# Patient Record
Sex: Female | Born: 1975
Health system: Southern US, Community
[De-identification: ages and names within clinical notes are randomized; demographics above are authoritative.]

## PROBLEM LIST (undated history)

## (undated) DIAGNOSIS — N83209 Unspecified ovarian cyst, unspecified side: Secondary | ICD-10-CM

## (undated) DIAGNOSIS — K5792 Diverticulitis of intestine, part unspecified, without perforation or abscess without bleeding: Secondary | ICD-10-CM

## (undated) HISTORY — PX: OVARIAN CYST REMOVAL: SHX89

## (undated) HISTORY — DX: Unspecified ovarian cyst, unspecified side: N83.209

## (undated) HISTORY — DX: Diverticulitis of intestine, part unspecified, without perforation or abscess without bleeding: K57.92

---

## 2004-08-03 HISTORY — PX: COLONOSCOPY: SHX174

## 2004-12-21 ENCOUNTER — Emergency Department: Payer: Self-pay | Admitting: Internal Medicine

## 2004-12-22 ENCOUNTER — Ambulatory Visit: Payer: Self-pay | Admitting: Internal Medicine

## 2005-05-14 ENCOUNTER — Observation Stay: Payer: Self-pay

## 2005-06-16 ENCOUNTER — Observation Stay: Payer: Self-pay

## 2005-06-17 ENCOUNTER — Ambulatory Visit: Payer: Self-pay

## 2005-07-22 ENCOUNTER — Observation Stay: Payer: Self-pay

## 2005-07-24 ENCOUNTER — Inpatient Hospital Stay: Payer: Self-pay | Admitting: Unknown Physician Specialty

## 2006-02-24 ENCOUNTER — Ambulatory Visit: Payer: Self-pay | Admitting: Internal Medicine

## 2006-02-25 ENCOUNTER — Ambulatory Visit: Payer: Self-pay | Admitting: Internal Medicine

## 2006-04-27 ENCOUNTER — Ambulatory Visit: Payer: Self-pay | Admitting: Gastroenterology

## 2008-01-28 ENCOUNTER — Observation Stay: Payer: Self-pay

## 2008-04-19 ENCOUNTER — Inpatient Hospital Stay: Payer: Self-pay | Admitting: Obstetrics & Gynecology

## 2010-07-15 ENCOUNTER — Ambulatory Visit: Payer: Self-pay

## 2010-08-03 HISTORY — PX: TUBAL LIGATION: SHX77

## 2010-12-12 ENCOUNTER — Inpatient Hospital Stay: Payer: Self-pay

## 2011-02-18 ENCOUNTER — Ambulatory Visit: Payer: Self-pay | Admitting: Family Medicine

## 2013-08-03 HISTORY — PX: LAPAROSCOPIC OOPHERECTOMY: SHX6507

## 2014-01-01 ENCOUNTER — Ambulatory Visit: Payer: Self-pay | Admitting: Obstetrics and Gynecology

## 2014-01-01 LAB — CBC
HCT: 37.7 % (ref 35.0–47.0)
HGB: 12.4 g/dL (ref 12.0–16.0)
MCH: 28.6 pg (ref 26.0–34.0)
MCHC: 33 g/dL (ref 32.0–36.0)
MCV: 87 fL (ref 80–100)
Platelet: 292 10*3/uL (ref 150–440)
RBC: 4.34 10*6/uL (ref 3.80–5.20)
RDW: 14.4 % (ref 11.5–14.5)
WBC: 11.2 10*3/uL — AB (ref 3.6–11.0)

## 2014-01-01 LAB — BASIC METABOLIC PANEL
Anion Gap: 8 (ref 7–16)
BUN: 11 mg/dL (ref 7–18)
Calcium, Total: 8.9 mg/dL (ref 8.5–10.1)
Chloride: 106 mmol/L (ref 98–107)
Co2: 24 mmol/L (ref 21–32)
Creatinine: 0.59 mg/dL — ABNORMAL LOW (ref 0.60–1.30)
EGFR (African American): >60
EGFR (Non-African Amer.): >60
GLUCOSE: 73 mg/dL (ref 65–99)
OSMOLALITY: 274 (ref 275–301)
Potassium: 3.8 mmol/L (ref 3.5–5.1)
Sodium: 138 mmol/L (ref 136–145)

## 2014-01-11 ENCOUNTER — Ambulatory Visit: Payer: Self-pay | Admitting: Obstetrics and Gynecology

## 2014-01-12 LAB — PATHOLOGY REPORT

## 2014-11-24 NOTE — Op Note (Signed)
PATIENT NAME:  Sherri Avila, Sherri Avila MR#:  [id number] DATE OF BIRTH:  1976-04-13  DATE OF PROCEDURE:  01/11/2014  PREOPERATIVE DIAGNOSIS: Pelvic mass.   POSTOPERATIVE DIAGNOSIS: Right ovarian cyst, likely serous cystadenoma based on appearance.   OPERATION PERFORMED: Laparoscopic bilateral salpingectomy, right oophorectomy.   ANESTHESIA USED: General.   PREOPERATIVE ANTIBIOTICS: None.   PRIMARY SURGEON: Florina Ou. Bonney Aid, MD  ASSISTANT: Conard Novak, MD  ESTIMATED BLOOD LOSS: 15 mL.  OPERATIVE FLUIDS: 1500 mL of crystalloid.   URINE OUTPUT: 200 mL of clear urine.   DRAINS OR TUBES: None.   IMPLANTS: None.   COMPLICATIONS: None.   FINDINGS: Normal intra-abdominal anatomy other than enlarged right ovary filling the midline of the pelvis. The uterus appeared grossly normal. Appendix appeared to be retrocecal and was not clearly visualized. The liver appeared normal in texture and color. The ureters were visualized bilaterally prior to and post procedure.   SPECIMENS REMOVED: Right tube and ovary as well as left tube.   PATIENT CONDITION FOLLOWING THE PROCEDURE: Stable.   PROCEDURE IN DETAIL: Risks, benefits and alternatives of the procedure were discussed with the patient prior to proceeding to the operating room. The patient was taken to the operating room, where she was placed under general endotracheal anesthesia. She was positioned in the dorsal lithotomy position using Allen stirrups, prepped and draped in the usual sterile fashion. A timeout procedure was performed. Attention was turned to the patient's pelvis. The bladder was straight catheterized using a red rubber catheter. An operative speculum was then placed. The anterior lip of the cervix was visualized and grasped with a single-tooth tenaculum. A Hulka tenaculum was then placed to allow manipulation of the uterus. The single-tooth tenaculum and speculum were removed. Attention was turned to the patient's abdomen.  The umbilicus was infiltrated with 1% lidocaine. A stab incision was made at the base of the umbilicus, and a 5 mm XCEL trocar was used to gain direct entry into the peritoneal cavity under visualization. Pneumoperitoneum was then established A 5 mm right assistant port and an 11 mm left assistant port were then placed under direct visualization. Attention was turned to the patient's right adnexa. The enlarged ovary was elevated using a blunt grasper. The ureter was visualized coursing well away from the IP ligament. The 5 mm Harmonic was then used to transect the IP ligament and free the ovary and tube from its attachments to the mesosalpinx and uterus. Once this had been accomplished, an Endo Catch bag was placed into the abdomen to attempt removal of the ovary. However, the ovary was too large to fit in the standard Endo Catch bag, and a laparoscopic sac was used to facilitate removal. In order to place the laparoscopic sac, the 11 mm left port was exchanged for a 15 mm port. The specimen was placed into the lap sac, and the lap sac was pulled out of the port site. The port was then removed around the lap sac. The ovary was lysed within the bag and was noted to contain clear serous fluid. The ovary was then removed piecemeal using a Kelly clamp until it was sufficiently decompressed to allow removal through the 15 mm port site. The 15 mm port was then replaced. Attention was turned to the patient's left adnexal structure. The left tube appeared grossly normal, had undergone prior ligation. It was removed from its attachments to the ovary and mesosalpinx using the 5 mm Harmonic. The pedicles were then reinspected and noted to be hemostatic.  Both ureters were once again visualized and noted to be peristalsing well away from the site of dissection. A Carter-Thomason was then used to close the 15 mm port site. There was a small defect that was still noted within the fascia which was closed with a #1 Vicryl on a UR-6  needle. Following this, there was no defect left in the fascia. Pneumoperitoneum was evacuated. The 15 mm port site was closed using a 4-0 Monocryl in a subcuticular fashion. All port sites were then dressed with Dermabond. Sponge, needle and instrument counts were correct x2. The patient tolerated the procedure well and was taken to the recovery room in stable condition.   ____________________________ Florina OuAndreas M. Bonney AidStaebler, MD ams:lb D: 01/12/2014 09:15:38 ET T: 01/12/2014 09:46:09 ET JOB#: [id number]  cc: Florina OuAndreas M. Bonney AidStaebler, MD, <Dictator> Carmel SacramentoANDREAS Cathrine MusterM Nettie Wyffels MD ELECTRONICALLY SIGNED 01/23/2014 9:13

## 2015-10-28 ENCOUNTER — Ambulatory Visit
Admission: EM | Admit: 2015-10-28 | Discharge: 2015-10-28 | Disposition: A | Discharge status: Home / Self Care | Payer: 59 | Attending: Emergency Medicine | Admitting: Emergency Medicine

## 2015-10-28 ENCOUNTER — Encounter: Payer: Self-pay | Admitting: Emergency Medicine

## 2015-10-28 DIAGNOSIS — K5732 Diverticulitis of large intestine without perforation or abscess without bleeding: Secondary | ICD-10-CM | POA: Diagnosis not present

## 2015-10-28 DIAGNOSIS — R1030 Lower abdominal pain, unspecified: Secondary | ICD-10-CM | POA: Diagnosis not present

## 2015-10-28 DIAGNOSIS — R1031 Right lower quadrant pain: Secondary | ICD-10-CM | POA: Diagnosis present

## 2015-10-28 LAB — COMPREHENSIVE METABOLIC PANEL
ALBUMIN: 4.2 g/dL (ref 3.5–5.0)
ALT: 29 U/L (ref 14–54)
ANION GAP: 7 (ref 5–15)
AST: 23 U/L (ref 15–41)
Alkaline Phosphatase: 67 U/L (ref 38–126)
BUN: 12 mg/dL (ref 6–20)
CHLORIDE: 103 mmol/L (ref 101–111)
CO2: 25 mmol/L (ref 22–32)
Calcium: 9 mg/dL (ref 8.9–10.3)
Creatinine, Ser: 0.69 mg/dL (ref 0.44–1.00)
GFR calc Af Amer: >60 mL/min (ref >60)
GFR calc non Af Amer: >60 mL/min (ref >60)
GLUCOSE: 86 mg/dL (ref 65–99)
POTASSIUM: 3.4 mmol/L — AB (ref 3.5–5.1)
SODIUM: 135 mmol/L (ref 135–145)
Total Bilirubin: 0.9 mg/dL (ref 0.3–1.2)
Total Protein: 8.2 g/dL — ABNORMAL HIGH (ref 6.5–8.1)

## 2015-10-28 LAB — CBC
HEMATOCRIT: 37.4 % (ref 35.0–47.0)
Hemoglobin: 12.6 g/dL (ref 12.0–16.0)
MCH: 28.1 pg (ref 26.0–34.0)
MCHC: 33.7 g/dL (ref 32.0–36.0)
MCV: 83.6 fL (ref 80.0–100.0)
PLATELETS: 290 10*3/uL (ref 150–440)
RBC: 4.48 MIL/uL (ref 3.80–5.20)
RDW: 14.3 % (ref 11.5–14.5)
WBC: 16.2 10*3/uL — AB (ref 3.6–11.0)

## 2015-10-28 LAB — URINALYSIS COMPLETE WITH MICROSCOPIC (ARMC ONLY)
GLUCOSE, UA: NEGATIVE mg/dL
Leukocytes, UA: NEGATIVE
Nitrite: NEGATIVE
Protein, ur: 100 mg/dL — AB
SPECIFIC GRAVITY, URINE: 1.02 (ref 1.005–1.030)
pH: 7 (ref 5.0–8.0)

## 2015-10-28 LAB — LIPASE, BLOOD: Lipase: 20 U/L (ref 11–51)

## 2015-10-28 LAB — PREGNANCY, URINE: PREG TEST UR: NEGATIVE

## 2015-10-28 MED ORDER — KETOROLAC TROMETHAMINE 60 MG/2ML IM SOLN
60.0000 mg | Freq: Once | INTRAMUSCULAR | Status: AC
  Administered 2015-10-28: 60 mg via INTRAMUSCULAR

## 2015-10-28 NOTE — Discharge Instructions (Signed)
Go to the [facility] ED now. Let them know if your abdominal pain changes, gets worse.

## 2015-10-28 NOTE — ED Notes (Signed)
Pt in with co mid abd pain for few days no n.v.d, no dysuria.  Pt has had fever at home.

## 2015-10-28 NOTE — ED Provider Notes (Signed)
HPI  SUBJECTIVE:  Sherri Avila is a 40 y.o. female who presents with sharp contraction-like, squeezing, cramping, constant lower abdominal pain for the last 2 days. States it is becoming more intense. Reports fevers Tmax 101.4. She reports anorexia, bilateral low back pain. No nausea, vomiting, abdominal distention. Had a normal bowel movement yesterday. No dysuria, urgency, frequency, cloudy or odorous urine, hematuria. No vaginal bleeding, discharge, vaginal odor, genital rash or itching. She is in a monogamous relationship with her husband, who is asymptomatic. STDs are not a concern today. Symptoms are worse with walking, movement, coughing, standing up straight. States the car ride over here was painful. No alleviating factors. She has tried Tylenol for this. She has taken an antipyretic in the past 6-8 hours. Past medical history of UTI, pyelonephritis, yeast infections. She is status post right nephrectomy with fallopian tube removal for an ovarian cyst. She still has her uterus and left ovary. Also a history of C-section. No history of gonorrhea, chlamydia, herpes, HIV, syphilis, BV, Trichomonas. LMP 3/15. PMD: Dr. Greggory Stallion at [facility] primary care.    History reviewed. No pertinent past medical history.  Past Surgical History  Procedure Laterality Date  . Cesarean section    . Ovarian cyst removal      History reviewed. No pertinent family history.  Social History  Substance Use Topics  . Smoking status: Never Smoker   . Smokeless tobacco: None  . Alcohol Use: Yes    No current facility-administered medications for this encounter. No current outpatient prescriptions on file.  No Known Allergies   ROS  As noted in HPI.   Physical Exam  BP 135/83 mmHg  Pulse 117  Temp(Src) 99.9 F (37.7 C) (Tympanic)  Resp 20  Ht  (1.626 m)  Wt 203 lb (92.08 kg)  BMI 34.83 kg/m2  SpO2 100%  LMP 10/16/2015 (Approximate)  Constitutional: Well developed, well nourished, Appears  uncomfortable. Eyes:  EOMI, conjunctiva normal bilaterally HENT: Normocephalic, atraumatic,mucus membranes moist Respiratory: Normal inspiratory effort Cardiovascular: Regular tachycardia GI: Normal appearance Soft, nondistended. Positive suprapubic and right lower quadrant tenderness. Pain most suprapubic area. Positive guarding, positive rebound. No flank tenderness. Pain with movement, reports abdominal pain when the table was kicked. Back: Questionable right CVA tenderness. GU: Deferred. skin: No rash, skin intact Musculoskeletal: no deformities Neurologic: Alert & oriented x 3, no focal neuro deficits Psychiatric: Speech and behavior appropriate   ED Course   Medications  ketorolac (TORADOL) injection 60 mg (60 mg Intramuscular Given 10/28/15 2104)    Orders Placed This Encounter  Procedures  . Urinalysis complete, with microscopic    Standing Status: Standing     Number of Occurrences: 1     Standing Expiration Date:   . Pregnancy, urine    Standing Status: Standing     Number of Occurrences: 1     Standing Expiration Date:     Results for orders placed or performed during the hospital encounter of 10/28/15 (from the past 24 hour(s))  Urinalysis complete, with microscopic     Status: Abnormal   Collection Time: 10/28/15  8:14 PM  Result Value Ref Range   Color, Urine YELLOW YELLOW   APPearance CLEAR CLEAR   Glucose, UA NEGATIVE NEGATIVE mg/dL   Bilirubin Urine 1+ (A) NEGATIVE   Ketones, ur 2+ (A) NEGATIVE mg/dL   Specific Gravity, Urine 1.020 1.005 - 1.030   Hgb urine dipstick 2+ (A) NEGATIVE   pH 7.0 5.0 - 8.0   Protein, ur 100 (A) NEGATIVE  mg/dL   Nitrite NEGATIVE NEGATIVE   Leukocytes, UA NEGATIVE NEGATIVE   RBC / HPF 6-30 0 - 5 RBC/hpf   WBC, UA 0-5 0 - 5 WBC/hpf   Bacteria, UA FEW (A) NONE SEEN   Squamous Epithelial / LPF 0-5 (A) NONE SEEN   Ca Oxalate Crys, UA PRESENT   Pregnancy, urine     Status: None   Collection Time: 10/28/15  8:14 PM  Result  Value Ref Range   Preg Test, Ur NEGATIVE NEGATIVE   No results found.  ED Clinical Impression  Lower abdominal pain   ED Assessment/Plan   Physical exam is concerning for surgical abdomen. In the differential is appendicitis, colitis, UTI, GU infection, endometriosis. Feel the patient is low risk for STI/PID. giving shot of Toradol here for pain control, will send to the [facility] ED for further workup. Declined Zofran. Discussed rationale for transfer. Patient agrees with plan.    *This clinic note was created using Dragon dictation software. Therefore, there may be occasional mistakes despite careful proofreading.  ?   Domenick GongAshley Gilliam Hawkes, MD 10/28/15 2155

## 2015-10-28 NOTE — ED Notes (Signed)
Patient states she has had a fever and sharp cramping abdominal pain for the past 48 hours

## 2015-10-29 ENCOUNTER — Emergency Department
Admission: EM | Admit: 2015-10-29 | Discharge: 2015-10-29 | Disposition: A | Discharge status: Home / Self Care | Payer: 59 | Attending: Emergency Medicine | Admitting: Emergency Medicine

## 2015-10-29 ENCOUNTER — Encounter: Payer: Self-pay | Admitting: Emergency Medicine

## 2015-10-29 ENCOUNTER — Emergency Department: Payer: 59

## 2015-10-29 DIAGNOSIS — K5732 Diverticulitis of large intestine without perforation or abscess without bleeding: Secondary | ICD-10-CM

## 2015-10-29 LAB — URINALYSIS COMPLETE WITH MICROSCOPIC (ARMC ONLY)
BILIRUBIN URINE: NEGATIVE
GLUCOSE, UA: NEGATIVE mg/dL
Nitrite: NEGATIVE
Protein, ur: 30 mg/dL — AB
Specific Gravity, Urine: 1.018 (ref 1.005–1.030)
pH: 6 (ref 5.0–8.0)

## 2015-10-29 LAB — POCT PREGNANCY, URINE: Preg Test, Ur: NEGATIVE

## 2015-10-29 MED ORDER — METRONIDAZOLE IN NACL 5-0.79 MG/ML-% IV SOLN
500.0000 mg | Freq: Once | INTRAVENOUS | Status: AC
  Administered 2015-10-29: 500 mg via INTRAVENOUS
  Filled 2015-10-29: qty 100

## 2015-10-29 MED ORDER — CIPROFLOXACIN IN D5W 400 MG/200ML IV SOLN
400.0000 mg | Freq: Once | INTRAVENOUS | Status: AC
  Administered 2015-10-29: 400 mg via INTRAVENOUS
  Filled 2015-10-29: qty 200

## 2015-10-29 MED ORDER — CIPROFLOXACIN HCL 500 MG PO TABS: 500.0000 mg | ORAL_TABLET | Freq: Two times a day (BID) | ORAL | Status: AC

## 2015-10-29 MED ORDER — METRONIDAZOLE 500 MG PO TABS: 500.0000 mg | ORAL_TABLET | Freq: Two times a day (BID) | ORAL | Status: AC

## 2015-10-29 MED ORDER — SODIUM CHLORIDE 0.9 % IV BOLUS (SEPSIS)
1000.0000 mL | Freq: Once | INTRAVENOUS | Status: AC
  Administered 2015-10-29: 1000 mL via INTRAVENOUS

## 2015-10-29 MED ORDER — IOPAMIDOL (ISOVUE-300) INJECTION 61%
100.0000 mL | Freq: Once | INTRAVENOUS | Status: AC | PRN
  Administered 2015-10-29: 100 mL via INTRAVENOUS

## 2015-10-29 MED ORDER — ONDANSETRON HCL 4 MG/2ML IJ SOLN
4.0000 mg | Freq: Once | INTRAMUSCULAR | Status: AC
  Administered 2015-10-29: 4 mg via INTRAVENOUS
  Filled 2015-10-29: qty 2

## 2015-10-29 MED ORDER — ONDANSETRON 4 MG PO TBDP
4.0000 mg | ORAL_TABLET | Freq: Three times a day (TID) | ORAL | Status: DC | PRN
Start: 2015-10-29 — End: 2015-12-26

## 2015-10-29 MED ORDER — MORPHINE SULFATE (PF) 4 MG/ML IV SOLN
4.0000 mg | Freq: Once | INTRAVENOUS | Status: AC
  Administered 2015-10-29: 4 mg via INTRAVENOUS
  Filled 2015-10-29: qty 1

## 2015-10-29 MED ORDER — DIATRIZOATE MEGLUMINE & SODIUM 66-10 % PO SOLN
15.0000 mL | Freq: Once | ORAL | Status: AC
  Administered 2015-10-29: 15 mL via ORAL

## 2015-10-29 MED ORDER — OXYCODONE-ACETAMINOPHEN 5-325 MG PO TABS: 1.0000 | ORAL_TABLET | Freq: Four times a day (QID) | ORAL | Status: DC | PRN

## 2015-10-29 NOTE — ED Provider Notes (Signed)
[facility] Emergency Department Provider Note  ____________________________________________  Time seen: Approximately 0057 AM  I have reviewed the triage vital signs and the nursing notes.   HISTORY  Chief Complaint Abdominal Pain    HPI Kathrin Folden is a 40 y.o. female who comes into the hospital today with sharp crampy abdominal pain. She also reports that she's had a fever. The patient's symptoms started her on Saturday night. She reports that she had a temperature to 101.4. She reports that this is mostly in her mid abdomen and she denies any vomiting or diarrhea. The patient has been taking Tylenol for the pain but it has not been helping. The patient went to urgent care today and was told to come here. She was given a shot of Toradol for pain but reports her pain is still a 7 out of 10 in intensity. She denies any sick contacts, pain with urination or dark looking urine. She reports that she feels as though she is in labor and has never had pain like this in the past. The patient was concerned she came in to get checked out.   History reviewed. No pertinent past medical history.  There are no active problems to display for this patient.   Past Surgical History  Procedure Laterality Date  . Cesarean section    . Ovarian cyst removal      Current Outpatient Rx  Name  Route  Sig  Dispense  Refill  . ciprofloxacin (CIPRO) 500 MG tablet   Oral   Take 1 tablet (500 mg total) by mouth 2 (two) times daily.   20 tablet   0   . metroNIDAZOLE (FLAGYL) 500 MG tablet   Oral   Take 1 tablet (500 mg total) by mouth 2 (two) times daily.   20 tablet   0   . ondansetron (ZOFRAN ODT) 4 MG disintegrating tablet   Oral   Take 1 tablet (4 mg total) by mouth every 8 (eight) hours as needed for nausea or vomiting.   20 tablet   0   . oxyCODONE-acetaminophen (ROXICET) 5-325 MG tablet   Oral   Take 1 tablet by mouth every 6 (six) hours as needed.   12  tablet   0     Allergies Review of patient's allergies indicates no known allergies.  No family history on file.  Social History Social History  Substance Use Topics  . Smoking status: Never Smoker   . Smokeless tobacco: None  . Alcohol Use: Yes    Review of Systems Constitutional: No fever/chills Eyes: No visual changes. ENT: No sore throat. Cardiovascular: Denies chest pain. Respiratory: Denies shortness of breath. Gastrointestinal:  abdominal pain.  No nausea, no vomiting.  No diarrhea.  No constipation. Genitourinary: Negative for dysuria. Musculoskeletal: Negative for back pain. Skin: Negative for rash. Neurological: Negative for headaches, focal weakness or numbness.  10-point ROS otherwise negative.  ____________________________________________   PHYSICAL EXAM:  VITAL SIGNS: ED Triage Vitals  Enc Vitals Group     BP 10/28/15 2146 129/72 mmHg     Pulse Rate 10/28/15 2146 103     Resp 10/28/15 2146 18     Temp 10/28/15 2146 99.1 F (37.3 C)     Temp Source 10/28/15 2146 Oral     SpO2 10/28/15 2146 98 %     Weight 10/28/15 2146 200 lb (90.719 kg)     Height 10/28/15 2146  (1.575 m)     Head Cir --  Peak Flow --      Pain Score 10/28/15 2147 7     Pain Loc --      Pain Edu? --      Excl. in GC? --     Constitutional: Alert and oriented. Well appearing and in mild distress. Eyes: Conjunctivae are normal. PERRL. EOMI. Head: Atraumatic. Nose: No congestion/rhinnorhea. Mouth/Throat: Mucous membranes are moist.  Oropharynx non-erythematous. Cardiovascular: Normal rate, regular rhythm. Grossly normal heart sounds.  Good peripheral circulation. Respiratory: Normal respiratory effort.  No retractions. Lungs CTAB. Gastrointestinal: Soft with right lower quadrant tendereness to palpation. No distention. Positive bowel sounds Musculoskeletal: No lower extremity tenderness nor edema.   Neurologic:  Normal speech and language.  Skin:  Skin is warm,  dry and intact.  Psychiatric: Mood and affect are normal.   ____________________________________________   LABS (all labs ordered are listed, but only abnormal results are displayed)  Labs Reviewed  CBC - Abnormal; Notable for the following:    WBC 16.2 (*)    All other components within normal limits  COMPREHENSIVE METABOLIC PANEL - Abnormal; Notable for the following:    Potassium 3.4 (*)    Total Protein 8.2 (*)    All other components within normal limits  URINALYSIS COMPLETEWITH MICROSCOPIC ([facility] ONLY) - Abnormal; Notable for the following:    Color, Urine YELLOW (*)    APPearance HAZY (*)    Ketones, ur TRACE (*)    Hgb urine dipstick 2+ (*)    Protein, ur 30 (*)    Leukocytes, UA 1+ (*)    Bacteria, UA RARE (*)    Squamous Epithelial / LPF 0-5 (*)    All other components within normal limits  LIPASE, BLOOD  POCT PREGNANCY, URINE   ____________________________________________  EKG  none ____________________________________________  RADIOLOGY  CT abd and pelvis: Findings consistent with acute sigmoid diverticulitis, no evidence for perforation or other comp location, no other acute intra-abdominal or pelvic process, normal appendix, hepatic steatosis. ____________________________________________   PROCEDURES  Procedure(s) performed: None  Critical Care performed: No  ____________________________________________   INITIAL IMPRESSION / ASSESSMENT AND PLAN / ED COURSE  Pertinent labs & imaging results that were available during my care of the patient were reviewed by me and considered in my medical decision making (see chart for details).  This is a 40 year old female who comes into the hospital today with some abdominal pain. The patient reports it is tender mid abdomen but she is having pain in her right upper and lower quadrant. I will give the patient a dose of morphine as well as Zofran and sent her for a CT scan of her abdomen and pelvis.  The  patient's CT scan is consistent with diverticulitis. I did give the patient a dose of ciprofloxacin and Flagyl. The patient is able to take some oral medication and her pain is improved. She'll be discharged to home for at home trial of treating her for diverticulitis. I did inform the patient of the strict return precautions such as worsening fevers, worsening pain, vomiting or any other significant concerns. The patient and her spouse understand and agree with the plans as stated. ____________________________________________   FINAL CLINICAL IMPRESSION(S) / ED DIAGNOSES  Final diagnoses:  Diverticulitis of large intestine without perforation or abscess without bleeding      Rebecka ApleyAllison P Webster, MD 10/29/15 [phone]

## 2015-10-29 NOTE — ED Notes (Signed)
Patient transported to CT 

## 2015-10-29 NOTE — ED Notes (Signed)
Patient back from CT.

## 2015-10-29 NOTE — Discharge Instructions (Signed)
Diverticulitis °Diverticulitis is inflammation or infection of small pouches in your colon that form when you have a condition called diverticulosis. The pouches in your colon are called diverticula. Your colon, or large intestine, is where water is absorbed and stool is formed. °Complications of diverticulitis can include: °· Bleeding. °· Severe infection. °· Severe pain. °· Perforation of your colon. °· Obstruction of your colon. °CAUSES  °Diverticulitis is caused by bacteria. °Diverticulitis happens when stool becomes trapped in diverticula. This allows bacteria to grow in the diverticula, which can lead to inflammation and infection. °RISK FACTORS °People with diverticulosis are at risk for diverticulitis. Eating a diet that does not include enough fiber from fruits and vegetables may make diverticulitis more likely to develop. °SYMPTOMS  °Symptoms of diverticulitis may include: °· Abdominal pain and tenderness. The pain is normally located on the left side of the abdomen, but may occur in other areas. °· Fever and chills. °· Bloating. °· Cramping. °· Nausea. °· Vomiting. °· Constipation. °· Diarrhea. °· Blood in your stool. °DIAGNOSIS  °Your health care provider will ask you about your medical history and do a physical exam. You may need to have tests done because many medical conditions can cause the same symptoms as diverticulitis. Tests may include: °· Blood tests. °· Urine tests. °· Imaging tests of the abdomen, including X-rays and CT scans. °When your condition is under control, your health care provider may recommend that you have a colonoscopy. A colonoscopy can show how severe your diverticula are and whether something else is causing your symptoms. °TREATMENT  °Most cases of diverticulitis are mild and can be treated at home. Treatment may include: °· Taking over-the-counter pain medicines. °· Following a clear liquid diet. °· Taking antibiotic medicines by mouth for 7-10 days. °More severe cases may  be treated at a hospital. Treatment may include: °· Not eating or drinking. °· Taking prescription pain medicine. °· Receiving antibiotic medicines through an IV tube. °· Receiving fluids and nutrition through an IV tube. °· Surgery. °HOME CARE INSTRUCTIONS  °· Follow your health care provider's instructions carefully. °· Follow a full liquid diet or other diet as directed by your health care provider. After your symptoms improve, your health care provider may tell you to change your diet. He or she may recommend you eat a high-fiber diet. Fruits and vegetables are good sources of fiber. Fiber makes it easier to pass stool. °· Take fiber supplements or probiotics as directed by your health care provider. °· Only take medicines as directed by your health care provider. °· Keep all your follow-up appointments. °SEEK MEDICAL CARE IF:  °· Your pain does not improve. °· You have a hard time eating food. °· Your bowel movements do not return to normal. °SEEK IMMEDIATE MEDICAL CARE IF:  °· Your pain becomes worse. °· Your symptoms do not get better. °· Your symptoms suddenly get worse. °· You have a fever. °· You have repeated vomiting. °· You have bloody or black, tarry stools. °MAKE SURE YOU:  °· Understand these instructions. °· Will watch your condition. °· Will get help right away if you are not doing well or get worse. °  °This information is not intended to replace advice given to you by your health care provider. Make sure you discuss any questions you have with your health care provider. °  °Document Released: 04/29/2005 Document Revised: 07/25/2013 Document Reviewed: 06/14/2013 °Elsevier Interactive Patient Education ©2016 [employer] ° °

## 2015-10-29 NOTE — ED Notes (Signed)
Patient with no complaints at this time. Respirations even and unlabored. Skin warm/dry. Discharge instructions reviewed with patient at this time. Patient given opportunity to voice concerns/ask questions. IV removed per policy and band-aid applied to site. Patient discharged at this time and left Emergency Department with steady gait.  

## 2015-12-26 ENCOUNTER — Encounter: Payer: Self-pay | Admitting: Emergency Medicine

## 2015-12-26 ENCOUNTER — Other Ambulatory Visit: Payer: Self-pay | Admitting: Family Medicine

## 2015-12-26 ENCOUNTER — Inpatient Hospital Stay
Admission: EM | Admit: 2015-12-26 | Discharge: 2015-12-29 | DRG: 392 | Disposition: A | Discharge status: Home / Self Care | Payer: 59 | Attending: General Surgery | Admitting: General Surgery

## 2015-12-26 ENCOUNTER — Ambulatory Visit
Admission: RE | Admit: 2015-12-26 | Discharge: 2015-12-26 | Disposition: A | Discharge status: Home / Self Care | Payer: 59 | Source: Ambulatory Visit | Attending: Family Medicine | Admitting: Family Medicine

## 2015-12-26 DIAGNOSIS — R197 Diarrhea, unspecified: Secondary | ICD-10-CM | POA: Insufficient documentation

## 2015-12-26 DIAGNOSIS — R109 Unspecified abdominal pain: Secondary | ICD-10-CM

## 2015-12-26 DIAGNOSIS — R509 Fever, unspecified: Secondary | ICD-10-CM

## 2015-12-26 DIAGNOSIS — R935 Abnormal findings on diagnostic imaging of other abdominal regions, including retroperitoneum: Secondary | ICD-10-CM | POA: Insufficient documentation

## 2015-12-26 DIAGNOSIS — R1084 Generalized abdominal pain: Secondary | ICD-10-CM | POA: Insufficient documentation

## 2015-12-26 DIAGNOSIS — R112 Nausea with vomiting, unspecified: Secondary | ICD-10-CM

## 2015-12-26 DIAGNOSIS — K5720 Diverticulitis of large intestine with perforation and abscess without bleeding: Secondary | ICD-10-CM | POA: Insufficient documentation

## 2015-12-26 DIAGNOSIS — K5792 Diverticulitis of intestine, part unspecified, without perforation or abscess without bleeding: Secondary | ICD-10-CM | POA: Diagnosis present

## 2015-12-26 LAB — URINALYSIS COMPLETE WITH MICROSCOPIC (ARMC ONLY)
Bilirubin Urine: NEGATIVE
Glucose, UA: NEGATIVE mg/dL
Leukocytes, UA: NEGATIVE
Nitrite: POSITIVE — AB
PROTEIN: NEGATIVE mg/dL
Specific Gravity, Urine: 1.04 — ABNORMAL HIGH (ref 1.005–1.030)
pH: 7 (ref 5.0–8.0)

## 2015-12-26 LAB — COMPREHENSIVE METABOLIC PANEL
ALBUMIN: 3.9 g/dL (ref 3.5–5.0)
ALT: 25 U/L (ref 14–54)
AST: 21 U/L (ref 15–41)
Alkaline Phosphatase: 58 U/L (ref 38–126)
Anion gap: 9 (ref 5–15)
BUN: 10 mg/dL (ref 6–20)
CHLORIDE: 97 mmol/L — AB (ref 101–111)
CO2: 25 mmol/L (ref 22–32)
CREATININE: 0.79 mg/dL (ref 0.44–1.00)
Calcium: 9 mg/dL (ref 8.9–10.3)
GFR calc non Af Amer: >60 mL/min (ref >60)
GLUCOSE: 87 mg/dL (ref 65–99)
Potassium: 3.4 mmol/L — ABNORMAL LOW (ref 3.5–5.1)
SODIUM: 131 mmol/L — AB (ref 135–145)
Total Bilirubin: 1.5 mg/dL — ABNORMAL HIGH (ref 0.3–1.2)
Total Protein: 8.3 g/dL — ABNORMAL HIGH (ref 6.5–8.1)

## 2015-12-26 LAB — CBC WITH DIFFERENTIAL/PLATELET
Basophils Absolute: 0 10*3/uL (ref 0–0.1)
Basophils Relative: 0 %
Eosinophils Absolute: 0 10*3/uL (ref 0–0.7)
HCT: 35.6 % (ref 35.0–47.0)
HEMOGLOBIN: 12.1 g/dL (ref 12.0–16.0)
LYMPHS ABS: 1.5 10*3/uL (ref 1.0–3.6)
MCH: 28.3 pg (ref 26.0–34.0)
MCHC: 33.9 g/dL (ref 32.0–36.0)
MCV: 83.7 fL (ref 80.0–100.0)
Monocytes Absolute: 0.5 10*3/uL (ref 0.2–0.9)
Monocytes Relative: 3 %
Neutro Abs: 13.2 10*3/uL — ABNORMAL HIGH (ref 1.4–6.5)
Platelets: 264 10*3/uL (ref 150–440)
RBC: 4.26 MIL/uL (ref 3.80–5.20)
RDW: 15.1 % — ABNORMAL HIGH (ref 11.5–14.5)
WBC: 15.2 10*3/uL — AB (ref 3.6–11.0)

## 2015-12-26 LAB — LACTIC ACID, PLASMA: Lactic Acid, Venous: 1 mmol/L (ref 0.5–2.0)

## 2015-12-26 MED ORDER — SODIUM CHLORIDE 0.9 % IV SOLN
Freq: Once | INTRAVENOUS | Status: AC
  Administered 2015-12-26: 16:00:00 via INTRAVENOUS

## 2015-12-26 MED ORDER — MORPHINE SULFATE (PF) 4 MG/ML IV SOLN
4.0000 mg | Freq: Once | INTRAVENOUS | Status: AC
  Administered 2015-12-26: 4 mg via INTRAVENOUS
  Filled 2015-12-26: qty 1

## 2015-12-26 MED ORDER — LACTATED RINGERS IV SOLN
INTRAVENOUS | Status: DC
  Administered 2015-12-26 – 2015-12-27 (×3): via INTRAVENOUS

## 2015-12-26 MED ORDER — HYDRALAZINE HCL 20 MG/ML IJ SOLN: 10.0000 mg | INTRAMUSCULAR | Status: DC | PRN

## 2015-12-26 MED ORDER — PIPERACILLIN-TAZOBACTAM 3.375 G IVPB: 3.3750 g | Freq: Three times a day (TID) | INTRAVENOUS | Status: DC

## 2015-12-26 MED ORDER — DIPHENHYDRAMINE HCL 50 MG/ML IJ SOLN: 25.0000 mg | Freq: Four times a day (QID) | INTRAMUSCULAR | Status: DC | PRN

## 2015-12-26 MED ORDER — ACETAMINOPHEN 325 MG PO TABS
650.0000 mg | ORAL_TABLET | Freq: Four times a day (QID) | ORAL | Status: DC | PRN
  Administered 2015-12-26 – 2015-12-28 (×4): 650 mg via ORAL
  Filled 2015-12-26 (×4): qty 2

## 2015-12-26 MED ORDER — PIPERACILLIN-TAZOBACTAM 3.375 G IVPB
3.3750 g | Freq: Three times a day (TID) | INTRAVENOUS | Status: DC
  Administered 2015-12-26 – 2015-12-28 (×5): 3.375 g via INTRAVENOUS
  Filled 2015-12-26 (×7): qty 50

## 2015-12-26 MED ORDER — PIPERACILLIN-TAZOBACTAM 3.375 G IVPB 30 MIN
3.3750 g | Freq: Once | INTRAVENOUS | Status: AC
  Administered 2015-12-26: 3.375 g via INTRAVENOUS
  Filled 2015-12-26: qty 50

## 2015-12-26 MED ORDER — MORPHINE SULFATE (PF) 4 MG/ML IV SOLN
4.0000 mg | INTRAVENOUS | Status: DC | PRN
  Administered 2015-12-26 – 2015-12-28 (×9): 4 mg via INTRAVENOUS
  Filled 2015-12-26 (×9): qty 1

## 2015-12-26 MED ORDER — DIPHENHYDRAMINE HCL 25 MG PO CAPS: 25.0000 mg | ORAL_CAPSULE | Freq: Four times a day (QID) | ORAL | Status: DC | PRN

## 2015-12-26 MED ORDER — HYDROMORPHONE HCL 1 MG/ML IJ SOLN
INTRAMUSCULAR | Status: AC
  Administered 2015-12-26: 0.5 mg via INTRAVENOUS
  Filled 2015-12-26: qty 1

## 2015-12-26 MED ORDER — ONDANSETRON HCL 4 MG/2ML IJ SOLN: 4.0000 mg | Freq: Four times a day (QID) | INTRAMUSCULAR | Status: DC | PRN

## 2015-12-26 MED ORDER — HYDROMORPHONE HCL 1 MG/ML IJ SOLN
0.5000 mg | INTRAMUSCULAR | Status: AC
  Administered 2015-12-26: 0.5 mg via INTRAVENOUS

## 2015-12-26 MED ORDER — IOPAMIDOL (ISOVUE-300) INJECTION 61%
100.0000 mL | Freq: Once | INTRAVENOUS | Status: AC | PRN
  Administered 2015-12-26: 100 mL via INTRAVENOUS

## 2015-12-26 MED ORDER — ENOXAPARIN SODIUM 40 MG/0.4ML ~~LOC~~ SOLN
40.0000 mg | SUBCUTANEOUS | Status: DC
  Administered 2015-12-26 – 2015-12-28 (×3): 40 mg via SUBCUTANEOUS
  Filled 2015-12-26 (×3): qty 0.4

## 2015-12-26 MED ORDER — PANTOPRAZOLE SODIUM 40 MG IV SOLR
40.0000 mg | Freq: Every day | INTRAVENOUS | Status: DC
  Administered 2015-12-26 – 2015-12-28 (×3): 40 mg via INTRAVENOUS
  Filled 2015-12-26 (×3): qty 40

## 2015-12-26 MED ORDER — ONDANSETRON HCL 4 MG/2ML IJ SOLN
4.0000 mg | Freq: Once | INTRAMUSCULAR | Status: AC
  Administered 2015-12-26: 4 mg via INTRAVENOUS
  Filled 2015-12-26: qty 2

## 2015-12-26 MED ORDER — ONDANSETRON 8 MG PO TBDP: 4.0000 mg | ORAL_TABLET | Freq: Four times a day (QID) | ORAL | Status: DC | PRN

## 2015-12-26 NOTE — Progress Notes (Signed)
Notified Dr. Everlene FarrierPabon of pt temperature 100.4. New order received for Tylenol.  Gave pt tylenol for fever and morphine for pain. Continue to assess.

## 2015-12-26 NOTE — ED Notes (Signed)
Pt up to restroom independently and with no distress. Pt denies having to urinate at this time.

## 2015-12-26 NOTE — ED Notes (Signed)
Sent to triage from CT.  Had outpt CT for abd pain.  They reports SB perf, WBC 16.9 and +UTI.  Pt a&o, skin w/d, NAD at this time

## 2015-12-26 NOTE — H&P (Signed)
Patient ID: Sherri Avila, female   DOB: 05/18/1976, 40 y.o.   MRN: [id number]  CC: ABDOMINAL PAIN  HPI Sherri Avila is a 40 y.o. female presents emergency department with a 2 day history of progressively worsening abdominal pain. Patient states the pain started similar to her attack of diverticulitis in March however this came on much more suddenly and much more severely. She has had nausea and vomiting with this over the last 24 hours. She had a fever at home up to 101.4. Her last bowel movement was today and was loose and runny. She denies any chest pain or shortness of breath. She does have a malaise secondary to emesis and not being able to eat. She is otherwise a relatively healthy female who takes no chronic medications.  HPI  History reviewed. No pertinent past medical history.  Past Surgical History  Procedure Laterality Date  . Cesarean section    . Ovarian cyst removal      No family history on file.  Social History Social History  Substance Use Topics  . Smoking status: Never Smoker   . Smokeless tobacco: None  . Alcohol Use: Yes    No Known Allergies  No current facility-administered medications for this encounter.   No current outpatient prescriptions on file.     Review of Systems A Multi-point review of systems was asked and was negative except for the findings documented in the history of present illness  Physical Exam Blood pressure 114/78, pulse 123, temperature 98.8 F (37.1 C), resp. rate 20, height 5\' 2"  (1.575 m), weight 92.987 kg (205 lb), SpO2 99 %. CONSTITUTIONAL: No acute distress. EYES: Pupils are equal, round, and reactive to light, Sclera are non-icteric. EARS, NOSE, MOUTH AND THROAT: The oropharynx is clear. The oral mucosa is pink and moist. Hearing is intact to voice. LYMPH NODES:  Lymph nodes in the neck are normal. RESPIRATORY:  Lungs are clear. There is normal respiratory effort, with equal breath sounds bilaterally, and without  pathologic use of accessory muscles. CARDIOVASCULAR: Heart is regular without murmurs, gallops, or rubs. GI: The abdomen is soft, tender to palpation left lower quadrant without rebound or guarding, and nondistended. There are no palpable masses. There is no hepatosplenomegaly. There are normal bowel sounds in all quadrants. GU: Rectal deferred.   MUSCULOSKELETAL: Normal muscle strength and tone. No cyanosis or edema.   SKIN: Turgor is good and there are no pathologic skin lesions or ulcers. NEUROLOGIC: Motor and sensation is grossly normal. Cranial nerves are grossly intact. PSYCH:  Oriented to person, place and time. Affect is normal.  Data Reviewed Images and labs reviewed. Labs concerning for leukocytosis of 15.2, CT scan with evidence of diverticulitis and a small fluid collection adjacent to the sigmoid colon. There is also a small amount of free intra-abdominal air consistent with a microperforation. I have personally reviewed the patient's imaging, laboratory findings and medical records.    Assessment    Diverticulitis with microperforation    Plan    40 year old female now with a complicated diverticulitis. Discussed at length with the patient and her husband the diagnosis and the treatment plan. Majority time IV antibiotics and bowel rest is all that is needed for recovery through the acute phase. Plan for admission, IV fluids, IV antibiotics, as needed medications. Patient voiced understanding and acceptance of the treatment plan at this time. Also discussed that should treatment fail this would lead to an urgent surgery and she again voiced understanding.     Time  spent with the patient was 30 minutes, with more than 50% of the time spent in face-to-face education, counseling and care coordination.     Ricarda Frame, MD FACS General Surgeon 12/26/2015, 5:21 PM

## 2015-12-26 NOTE — ED Provider Notes (Signed)
[facility] Emergency Department Provider Note   ____________________________________________  Time seen: Approximately 3:24 PM  I have reviewed the triage vital signs and the nursing notes.   HISTORY  Chief Complaint Abdominal Pain    HPI Sherri Avila is a 40 y.o. female who says she had started having abdominal pain this past Tuesday today being Friday. Pain is gotten worse she would see her doctor had a CT ordered a CT shows diverticulitis with perforation. Patient is running a fever and the pain is severe. As diffuse in her abdomen. It is made worse by deep breathing and bumps in the road as she drives over them.   History reviewed. No pertinent past medical history.  Patient Active Problem List   Diagnosis Date Noted  . Diverticulitis 12/26/2015    Past Surgical History  Procedure Laterality Date  . Cesarean section    . Ovarian cyst removal      No current outpatient prescriptions on file.  Allergies Review of patient's allergies indicates no known allergies.  No family history on file.  Social History Social History  Substance Use Topics  . Smoking status: Never Smoker   . Smokeless tobacco: None  . Alcohol Use: Yes    Review of Systems Constitutional: N fever Eyes: No visual changes. ENT: No sore throat. Cardiovascular: Denies chest pain. Respiratory: Denies shortness of breath. Gastrointestinal: See history of present illness Genitourinary: Negative for dysuria. Musculoskeletal: Negative for back pain. Skin: Negative for rash. Neurological: Negative for headaches, focal weakness or numbness.  10-point ROS otherwise negative.  ____________________________________________   PHYSICAL EXAM:  VITAL SIGNS: ED Triage Vitals  Enc Vitals Group     BP 12/26/15 1504 114/78 mmHg     Pulse Rate 12/26/15 1504 123     Resp 12/26/15 1504 20     Temp 12/26/15 1504 98.8 F (37.1 C)     Temp src --      SpO2 12/26/15  1504 99 %     Weight 12/26/15 1504 205 lb (92.987 kg)     Height 12/26/15 1504 5\' 2"  (1.575 m)     Head Cir --      Peak Flow --      Pain Score 12/26/15 1457 8     Pain Loc --      Pain Edu? --      Excl. in GC? --     Constitutional: Alert and oriented. Well appearing and in no acute distress. Eyes: Conjunctivae are normal. PERRL. EOMI. Head: Atraumatic. Nose: No congestion/rhinnorhea. Mouth/Throat: Mucous membranes are moist.  Oropharynx non-erythematous. Neck: No stridor.   Cardiovascular: Normal rate, regular rhythm. Grossly normal heart sounds.  Good peripheral circulation. Respiratory: Normal respiratory effort.  No retractions. Lungs CTAB. Gastrointestinal: Soft Diffusely tender to palpation and percussion. Decreased bowel sounds no bruits  }Musculoskeletal: No lower extremity tenderness nor edema.  No joint effusions. Neurologic:  Normal speech and language. No gross focal neurologic deficits are appreciated. No gait instability. Skin:  Skin is warm, dry and intact. No rash noted. Psychiatric: Mood and affect are normal. Speech and behavior are normal.  ____________________________________________   LABS (all labs ordered are listed, but only abnormal results are displayed)  Labs Reviewed  COMPREHENSIVE METABOLIC PANEL - Abnormal; Notable for the following:    Sodium 131 (*)    Potassium 3.4 (*)    Chloride 97 (*)    Total Protein 8.3 (*)    Total Bilirubin 1.5 (*)    All  other components within normal limits  CBC WITH DIFFERENTIAL/PLATELET - Abnormal; Notable for the following:    WBC 15.2 (*)    RDW 15.1 (*)    Neutro Abs 13.2 (*)    All other components within normal limits  CULTURE, BLOOD (ROUTINE X 2)  CULTURE, BLOOD (ROUTINE X 2)  URINE CULTURE  LACTIC ACID, PLASMA  LACTIC ACID, PLASMA  URINALYSIS COMPLETEWITH MICROSCOPIC ([facility] ONLY)  CBC WITH DIFFERENTIAL/PLATELET  LACTIC ACID, PLASMA  LACTIC ACID, PLASMA    ____________________________________________  EKG   ____________________________________________  RADIOLOGY  Radiology read CT is diverticulitis with perforation. ____________________________________________   PROCEDURES    ____________________________________________   INITIAL IMPRESSION / ASSESSMENT AND PLAN / ED COURSE  Pertinent labs & imaging results that were available during my care of the patient were reviewed by me and considered in my medical decision making (see chart for details).   ____________________________________________   FINAL CLINICAL IMPRESSION(S) / ED DIAGNOSES  Final diagnoses:  Diverticulitis of colon with perforation      NEW MEDICATIONS STARTED DURING THIS VISIT:  New Prescriptions   No medications on file     Note:  This document was prepared using Dragon voice recognition software and may include unintentional dictation errors.    Arnaldo Natal, MD 12/26/15 [phone]

## 2015-12-27 LAB — BASIC METABOLIC PANEL
ANION GAP: 7 (ref 5–15)
BUN: 8 mg/dL (ref 6–20)
CALCIUM: 8.4 mg/dL — AB (ref 8.9–10.3)
CO2: 25 mmol/L (ref 22–32)
Chloride: 104 mmol/L (ref 101–111)
Creatinine, Ser: 0.72 mg/dL (ref 0.44–1.00)
GLUCOSE: 85 mg/dL (ref 65–99)
Potassium: 3.3 mmol/L — ABNORMAL LOW (ref 3.5–5.1)
Sodium: 136 mmol/L (ref 135–145)

## 2015-12-27 LAB — CBC
HCT: 31.2 % — ABNORMAL LOW (ref 35.0–47.0)
Hemoglobin: 10.4 g/dL — ABNORMAL LOW (ref 12.0–16.0)
MCH: 28.7 pg (ref 26.0–34.0)
MCHC: 33.3 g/dL (ref 32.0–36.0)
MCV: 86.1 fL (ref 80.0–100.0)
PLATELETS: 221 10*3/uL (ref 150–440)
RBC: 3.62 MIL/uL — ABNORMAL LOW (ref 3.80–5.20)
RDW: 15.2 % — AB (ref 11.5–14.5)
WBC: 12.4 10*3/uL — AB (ref 3.6–11.0)

## 2015-12-27 LAB — URINE CULTURE

## 2015-12-27 LAB — MAGNESIUM: Magnesium: 2 mg/dL (ref 1.7–2.4)

## 2015-12-27 LAB — PHOSPHORUS: PHOSPHORUS: 2.9 mg/dL (ref 2.5–4.6)

## 2015-12-27 MED ORDER — SODIUM CHLORIDE 0.9 % IV SOLN
INTRAVENOUS | Status: DC
  Administered 2015-12-27 – 2015-12-28 (×3): via INTRAVENOUS

## 2015-12-27 NOTE — Progress Notes (Signed)
CC: Abdominal pain Subjective: Patient reports that her pain is much improved today from on admission. She denies any fevers, chills, nausea, vomiting. Feeling much better overall.  Objective: Vital signs in last 24 hours: Temp:  [98.8 F (37.1 C)-100.4 F (38 C)] 99.1 F (37.3 C) (05/26 1357) Pulse Rate:  [98-123] 98 (05/26 1357) Resp:  [15-22] 17 (05/26 1357) BP: (91-120)/(52-78) 120/71 mmHg (05/26 1357) SpO2:  [95 %-100 %] 99 % (05/26 1357) Weight:  [92.987 kg (205 lb)] 92.987 kg (205 lb) (05/25 1504) Last BM Date: 12/26/15  Intake/Output from previous day: 05/25 0701 - 05/26 0700 In: 1080 [I.V.:1030; IV Piggyback:50] Out: 1200 [Urine:1200] Intake/Output this shift: Total I/O In: 215.6 [I.V.:208.3; IV Piggyback:7.3] Out: 1000 [Urine:1000]  Physical exam:  Gen.: No acute distress Chest: Clear to sedation Heart: Regular rate and rhythm Abdomen: Soft, nondistended, minimally tender to deep palpation in the left lower quadrant.  Lab Results: CBC   Recent Labs  12/26/15 1533 12/27/15 0423  WBC 15.2* 12.4*  HGB 12.1 10.4*  HCT 35.6 31.2*  PLT 264 221   BMET  Recent Labs  12/26/15 1533 12/27/15 0423  NA 131* 136  K 3.4* 3.3*  CL 97* 104  CO2 25 25  GLUCOSE 87 85  BUN 10 8  CREATININE 0.79 0.72  CALCIUM 9.0 8.4*   PT/INR No results for input(s): LABPROT, INR in the last 72 hours. ABG No results for input(s): PHART, HCO3 in the last 72 hours.  Invalid input(s): PCO2, PO2  Studies/Results: Ct Abdomen Pelvis W Contrast  12/26/2015  CLINICAL DATA:  Abdominal pain for 2 days, elevated temperature to 1001.4, right upper quadrant pain EXAM: CT ABDOMEN AND PELVIS WITH CONTRAST TECHNIQUE: Multidetector CT imaging of the abdomen and pelvis was performed using the standard protocol following bolus administration of intravenous contrast. CONTRAST:  100mL ISOVUE-300 IOPAMIDOL (ISOVUE-300) INJECTION 61% COMPARISON:  10/29/2015 FINDINGS: Lower chest: Consolidation  inferior medial right middle lobe consistent with atelectasis Hepatobiliary: Mild diffuse hepatic steatosis. Gallbladder normal. Mild hepatomegaly. Pancreas: Pancreas is normal Spleen: Spleen is normal Adrenals/Urinary Tract: Adrenal glands are normal. Right kidney is normal. 5 mm low-attenuation lesion midpole left kidney stable. No hydronephrosis. Stomach/Bowel: Stomach is normal. Appendix is normal. Small bowel is normal. There is diverticulosis of the sigmoid colon. There is wall thickening of the proximal sigmoid colon with moderate surrounding inflammatory change. There is a very small volume of free fluid lateral to this portion of colon. There is a 2 cm oval fluid collection in the anterior left pelvis at this level which shows partial rim enhancement. A tiny focus of air seen within this collection. Vascular/Lymphatic: No significant vascular abnormalities. Nonpathologic retroperitoneal and mesenteric lymph nodes present. Reproductive: 2 cm left ovarian cyst stable Other: Small volume pneumoperitoneum appreciated over the dome of the liver. Musculoskeletal: No acute findings IMPRESSION: Findings consistent with acute diverticulitis associated with small pericolonic abscess and small volume pneumoperitoneum indicating perforation. Critical Value/emergent results were called by telephone at the time of interpretation on 12/26/2015 at 2:35 pm to [facility], the triage nurse at the referring physician's office, who verbally acknowledged these results. These results will be called to the ordering clinician or representative by the Radiology Department at the imaging location. Electronically Signed   By: Esperanza Heiraymond  Rubner M.D.   On: 12/26/2015 14:39    Anti-infectives: Anti-infectives    Start     Dose/Rate Route Frequency Ordered Stop   12/26/15 2130  piperacillin-tazobactam (ZOSYN) IVPB 3.375 g     3.375 g  12.5 mL/hr over 240 Minutes Intravenous Every 8 hours 12/26/15 1907     12/26/15 1915   piperacillin-tazobactam (ZOSYN) IVPB 3.375 g  Status:  Discontinued     3.375 g 12.5 mL/hr over 240 Minutes Intravenous Every 8 hours 12/26/15 1905 12/26/15 1907   12/26/15 1515  piperacillin-tazobactam (ZOSYN) IVPB 3.375 g     3.375 g 100 mL/hr over 30 Minutes Intravenous  Once 12/26/15 1511 12/26/15 1649      Assessment/Plan:  40 year old female with diverticulitis. Appears to be improving on IV antibiotics and bowel rest. Discussed with the patient again the need for her to have normal labs and be mostly pain-free prior to starting a diet. We will reassess with labs and exam in the morning and hopefully start her on a diet at that time. Once she is tolerating a diet she'll be transitioned to oral antibiotics and discharged home on a 2 week course of these. Urge ambulation and incentive spirometer usage.  Miyana Mordecai T. Tonita Cong, MD, FACS  12/27/2015

## 2015-12-27 NOTE — Care Management (Signed)
No discharge needs identified by the care team.  Admitted with acute diverticulitis.  IVF, bowel rest, IV antibiottics

## 2015-12-28 LAB — BASIC METABOLIC PANEL
Anion gap: 8 (ref 5–15)
BUN: 7 mg/dL (ref 6–20)
CALCIUM: 8.2 mg/dL — AB (ref 8.9–10.3)
CHLORIDE: 105 mmol/L (ref 101–111)
CO2: 22 mmol/L (ref 22–32)
CREATININE: 0.71 mg/dL (ref 0.44–1.00)
GFR calc non Af Amer: >60 mL/min (ref >60)
GLUCOSE: 69 mg/dL (ref 65–99)
Potassium: 3.6 mmol/L (ref 3.5–5.1)
Sodium: 135 mmol/L (ref 135–145)

## 2015-12-28 LAB — CBC
HEMATOCRIT: 31 % — AB (ref 35.0–47.0)
Hemoglobin: 10.4 g/dL — ABNORMAL LOW (ref 12.0–16.0)
MCH: 29.1 pg (ref 26.0–34.0)
MCHC: 33.5 g/dL (ref 32.0–36.0)
MCV: 86.8 fL (ref 80.0–100.0)
Platelets: 219 10*3/uL (ref 150–440)
RBC: 3.57 MIL/uL — ABNORMAL LOW (ref 3.80–5.20)
RDW: 15.2 % — AB (ref 11.5–14.5)
WBC: 10.7 10*3/uL (ref 3.6–11.0)

## 2015-12-28 MED ORDER — AMOXICILLIN-POT CLAVULANATE 875-125 MG PO TABS: 1.0000 | ORAL_TABLET | Freq: Two times a day (BID) | ORAL | Status: DC

## 2015-12-28 MED ORDER — AMOXICILLIN-POT CLAVULANATE 875-125 MG PO TABS
1.0000 | ORAL_TABLET | Freq: Two times a day (BID) | ORAL | Status: DC
  Administered 2015-12-28: 1 via ORAL
  Filled 2015-12-28: qty 1

## 2015-12-28 NOTE — Progress Notes (Signed)
CC: Abdominal pain Subjective: Patient states that her pain has improved but has not really gone away. She denies any fevers or chills and states that she is hungry.  Objective: Vital signs in last 24 hours: Temp:  [98.3 F (36.8 C)-99.1 F (37.3 C)] 98.3 F (36.8 C) (05/27 0452) Pulse Rate:  [94-98] 94 (05/27 0452) Resp:  [17-20] 20 (05/27 0452) BP: (120)/(71-80) 120/74 mmHg (05/27 0452) SpO2:  [99 %-100 %] 100 % (05/27 0452) Last BM Date: 12/26/15  Intake/Output from previous day: 05/26 0701 - 05/27 0700 In: 2128.6 [I.V.:2071.3; IV Piggyback:57.3] Out: 1900 [Urine:1900] Intake/Output this shift: Total I/O In: 310.6 [I.V.:310.6] Out: 0   Physical exam:  Gen.: No acute distress Chest: Clear to auscultation Heart: Regular rhythm Abdomen: Soft, nondistended, minimally tender to deep palpation in the left lower quadrant.  Lab Results: CBC   Recent Labs  12/27/15 0423 12/28/15 0525  WBC 12.4* 10.7  HGB 10.4* 10.4*  HCT 31.2* 31.0*  PLT 221 219   BMET  Recent Labs  12/27/15 0423 12/28/15 0525  NA 136 135  K 3.3* 3.6  CL 104 105  CO2 25 22  GLUCOSE 85 69  BUN 8 7  CREATININE 0.72 0.71  CALCIUM 8.4* 8.2*   PT/INR No results for input(s): LABPROT, INR in the last 72 hours. ABG No results for input(s): PHART, HCO3 in the last 72 hours.  Invalid input(s): PCO2, PO2  Studies/Results: Ct Abdomen Pelvis W Contrast  12/26/2015  CLINICAL DATA:  Abdominal pain for 2 days, elevated temperature to 1001.4, right upper quadrant pain EXAM: CT ABDOMEN AND PELVIS WITH CONTRAST TECHNIQUE: Multidetector CT imaging of the abdomen and pelvis was performed using the standard protocol following bolus administration of intravenous contrast. CONTRAST:  100mL ISOVUE-300 IOPAMIDOL (ISOVUE-300) INJECTION 61% COMPARISON:  10/29/2015 FINDINGS: Lower chest: Consolidation inferior medial right middle lobe consistent with atelectasis Hepatobiliary: Mild diffuse hepatic steatosis.  Gallbladder normal. Mild hepatomegaly. Pancreas: Pancreas is normal Spleen: Spleen is normal Adrenals/Urinary Tract: Adrenal glands are normal. Right kidney is normal. 5 mm low-attenuation lesion midpole left kidney stable. No hydronephrosis. Stomach/Bowel: Stomach is normal. Appendix is normal. Small bowel is normal. There is diverticulosis of the sigmoid colon. There is wall thickening of the proximal sigmoid colon with moderate surrounding inflammatory change. There is a very small volume of free fluid lateral to this portion of colon. There is a 2 cm oval fluid collection in the anterior left pelvis at this level which shows partial rim enhancement. A tiny focus of air seen within this collection. Vascular/Lymphatic: No significant vascular abnormalities. Nonpathologic retroperitoneal and mesenteric lymph nodes present. Reproductive: 2 cm left ovarian cyst stable Other: Small volume pneumoperitoneum appreciated over the dome of the liver. Musculoskeletal: No acute findings IMPRESSION: Findings consistent with acute diverticulitis associated with small pericolonic abscess and small volume pneumoperitoneum indicating perforation. Critical Value/emergent results were called by telephone at the time of interpretation on 12/26/2015 at 2:35 pm to [facility], the triage nurse at the referring physician's office, who verbally acknowledged these results. These results will be called to the ordering clinician or representative by the Radiology Department at the imaging location. Electronically Signed   By: Esperanza Heiraymond  Rubner M.D.   On: 12/26/2015 14:39    Anti-infectives: Anti-infectives    Start     Dose/Rate Route Frequency Ordered Stop   12/28/15 1900  amoxicillin-clavulanate (AUGMENTIN) 875-125 MG per tablet 1 tablet     1 tablet Oral Every 12 hours 12/28/15 0916     12/28/15  1000  amoxicillin-clavulanate (AUGMENTIN) 875-125 MG per tablet 1 tablet  Status:  Discontinued     1 tablet Oral Every 12 hours 12/28/15  0911 12/28/15 0916   12/26/15 2130  piperacillin-tazobactam (ZOSYN) IVPB 3.375 g  Status:  Discontinued     3.375 g 12.5 mL/hr over 240 Minutes Intravenous Every 8 hours 12/26/15 1907 12/28/15 0911   12/26/15 1915  piperacillin-tazobactam (ZOSYN) IVPB 3.375 g  Status:  Discontinued     3.375 g 12.5 mL/hr over 240 Minutes Intravenous Every 8 hours 12/26/15 1905 12/26/15 1907   12/26/15 1515  piperacillin-tazobactam (ZOSYN) IVPB 3.375 g     3.375 g 100 mL/hr over 30 Minutes Intravenous  Once 12/26/15 1511 12/26/15 1649      Assessment/Plan:  40 year old female admitted with diverticulitis with microperforation. White was again has normalized and her pain is improved. Plan to transition to oral antibiotics today and start a clear liquid diet. Should she tolerate the oral antibiotics and clear liquid diet could possibly discharge home in the next 12-24 hours. Encourage ambulation and incentive from her usage.  Laityn Bensen T. Tonita Cong, MD, FACS  12/28/2015

## 2015-12-29 LAB — BASIC METABOLIC PANEL
Anion gap: 7 (ref 5–15)
CALCIUM: 8.5 mg/dL — AB (ref 8.9–10.3)
CHLORIDE: 103 mmol/L (ref 101–111)
CO2: 26 mmol/L (ref 22–32)
CREATININE: 0.59 mg/dL (ref 0.44–1.00)
GFR calc Af Amer: >60 mL/min (ref >60)
GFR calc non Af Amer: >60 mL/min (ref >60)
GLUCOSE: 99 mg/dL (ref 65–99)
Potassium: 3.2 mmol/L — ABNORMAL LOW (ref 3.5–5.1)
Sodium: 136 mmol/L (ref 135–145)

## 2015-12-29 LAB — CBC
HEMATOCRIT: 29.3 % — AB (ref 35.0–47.0)
HEMOGLOBIN: 9.9 g/dL — AB (ref 12.0–16.0)
MCH: 29.2 pg (ref 26.0–34.0)
MCHC: 33.8 g/dL (ref 32.0–36.0)
MCV: 86.4 fL (ref 80.0–100.0)
Platelets: 267 10*3/uL (ref 150–440)
RBC: 3.39 MIL/uL — ABNORMAL LOW (ref 3.80–5.20)
RDW: 15.1 % — AB (ref 11.5–14.5)
WBC: 8.4 10*3/uL (ref 3.6–11.0)

## 2015-12-29 MED ORDER — AMOXICILLIN-POT CLAVULANATE 875-125 MG PO TABS: 1.0000 | ORAL_TABLET | Freq: Two times a day (BID) | ORAL | Status: DC

## 2015-12-29 MED ORDER — HYDROCODONE-ACETAMINOPHEN 5-325 MG PO TABS: 1.0000 | ORAL_TABLET | Freq: Four times a day (QID) | ORAL | Status: DC | PRN

## 2015-12-29 NOTE — Discharge Summary (Signed)
Patient ID: Idelia SalmShirley Corcino MRN: [id number] DOB/AGE: 41/12/1975 41 y.o.  Admit date: 12/26/2015 Discharge date: 12/29/2015  Discharge Diagnoses:  Diverticulitis  Procedures Performed: None  Discharged Condition: good  Hospital Course: Patient admitted from ER with diverticulitis with microperforation. Labs normalized and pain went away with antibiotics. Able to be discharged home on oral antibiotics.  Discharge Orders: HOME  Disposition: 01-Home or Self Care  Discharge Medications:   Medication List    TAKE these medications        amoxicillin-clavulanate 875-125 MG tablet  Commonly known as:  AUGMENTIN  Take 1 tablet by mouth every 12 (twelve) hours.     HYDROcodone-acetaminophen 5-325 MG tablet  Commonly known as:  NORCO  Take 1 tablet by mouth every 6 (six) hours as needed for moderate pain.         Follwup: Follow-up Information    Follow up with [facility]. Schedule an appointment as soon as possible for a visit in 2 weeks.   Why:  diverticulitis with abscess   Contact information:   [address] [place] [id number] [phone]      Signed: Ricarda FrameCharles Abijah Roussel 12/29/2015, 7:33 AM

## 2015-12-29 NOTE — Discharge Instructions (Signed)
Diverticulitis °Diverticulitis is inflammation or infection of small pouches in your colon that form when you have a condition called diverticulosis. The pouches in your colon are called diverticula. Your colon, or large intestine, is where water is absorbed and stool is formed. °Complications of diverticulitis can include: °· Bleeding. °· Severe infection. °· Severe pain. °· Perforation of your colon. °· Obstruction of your colon. °CAUSES  °Diverticulitis is caused by bacteria. °Diverticulitis happens when stool becomes trapped in diverticula. This allows bacteria to grow in the diverticula, which can lead to inflammation and infection. °RISK FACTORS °People with diverticulosis are at risk for diverticulitis. Eating a diet that does not include enough fiber from fruits and vegetables may make diverticulitis more likely to develop. °SYMPTOMS  °Symptoms of diverticulitis may include: °· Abdominal pain and tenderness. The pain is normally located on the left side of the abdomen, but may occur in other areas. °· Fever and chills. °· Bloating. °· Cramping. °· Nausea. °· Vomiting. °· Constipation. °· Diarrhea. °· Blood in your stool. °DIAGNOSIS  °Your health care provider will ask you about your medical history and do a physical exam. You may need to have tests done because many medical conditions can cause the same symptoms as diverticulitis. Tests may include: °· Blood tests. °· Urine tests. °· Imaging tests of the abdomen, including X-rays and CT scans. °When your condition is under control, your health care provider may recommend that you have a colonoscopy. A colonoscopy can show how severe your diverticula are and whether something else is causing your symptoms. °TREATMENT  °Most cases of diverticulitis are mild and can be treated at home. Treatment may include: °· Taking over-the-counter pain medicines. °· Following a clear liquid diet. °· Taking antibiotic medicines by mouth for 7-10 days. °More severe cases may  be treated at a hospital. Treatment may include: °· Not eating or drinking. °· Taking prescription pain medicine. °· Receiving antibiotic medicines through an IV tube. °· Receiving fluids and nutrition through an IV tube. °· Surgery. °HOME CARE INSTRUCTIONS  °· Follow your health care provider's instructions carefully. °· Follow a full liquid diet or other diet as directed by your health care provider. After your symptoms improve, your health care provider may tell you to change your diet. He or she may recommend you eat a high-fiber diet. Fruits and vegetables are good sources of fiber. Fiber makes it easier to pass stool. °· Take fiber supplements or probiotics as directed by your health care provider. °· Only take medicines as directed by your health care provider. °· Keep all your follow-up appointments. °SEEK MEDICAL CARE IF:  °· Your pain does not improve. °· You have a hard time eating food. °· Your bowel movements do not return to normal. °SEEK IMMEDIATE MEDICAL CARE IF:  °· Your pain becomes worse. °· Your symptoms do not get better. °· Your symptoms suddenly get worse. °· You have a fever. °· You have repeated vomiting. °· You have bloody or black, tarry stools. °MAKE SURE YOU:  °· Understand these instructions. °· Will watch your condition. °· Will get help right away if you are not doing well or get worse. °  °This information is not intended to replace advice given to you by your health care provider. Make sure you discuss any questions you have with your health care provider. °  °Document Released: 04/29/2005 Document Revised: 07/25/2013 Document Reviewed: 06/14/2013 °Elsevier Interactive Patient Education ©2016 [employer] ° °

## 2015-12-31 LAB — CULTURE, BLOOD (ROUTINE X 2)
CULTURE: NO GROWTH
Culture: NO GROWTH

## 2016-01-03 ENCOUNTER — Ambulatory Visit (INDEPENDENT_AMBULATORY_CARE_PROVIDER_SITE_OTHER): Payer: 59 | Admitting: General Surgery

## 2016-01-03 ENCOUNTER — Encounter: Payer: Self-pay | Admitting: General Surgery

## 2016-01-03 VITALS — BP 133/83 | HR 92 | Temp 98.3°F | Ht 62.0 in | Wt 200.2 lb

## 2016-01-03 DIAGNOSIS — K5720 Diverticulitis of large intestine with perforation and abscess without bleeding: Secondary | ICD-10-CM | POA: Diagnosis not present

## 2016-01-03 DIAGNOSIS — E669 Obesity, unspecified: Secondary | ICD-10-CM | POA: Insufficient documentation

## 2016-01-03 MED ORDER — NA SULFATE-K SULFATE-MG SULF 17.5-3.13-1.6 GM/177ML PO SOLN: 1.0000 | ORAL | Status: DC

## 2016-01-03 MED ORDER — AMOXICILLIN-POT CLAVULANATE 875-125 MG PO TABS
1.0000 | ORAL_TABLET | Freq: Two times a day (BID) | ORAL | Status: DC
Start: 2016-01-03 — End: 2016-02-20

## 2016-01-03 NOTE — Patient Instructions (Addendum)
You will need to have a Colonoscopy in about 6 weeks. This has been scheduled on 02/17/16.  We will see you back after your Colonoscopy to discuss surgery.  We have given you a prescription of Antibiotics. Please continue on Augmentin until you are completely pain free. If you fill this precription, you will need to be seen in the clinic. Call and we will schedule your appointment.  Please call with any questions or concerns.

## 2016-01-03 NOTE — Progress Notes (Signed)
Outpatient Surgical Follow Up  01/03/2016  Idelia SalmShirley Boulais is an 40 y.o. female.   Chief Complaint  Patient presents with  . Hospitalization Follow-up    Diverticulitis with Perforation    HPI: 40 year old female returns to clinic in follow-up of recent hospitalization for diverticulitis. She had a microperforation that time. She states that she's continued to improve since discharge. She continues on antibiotics and still has minor tenderness to the left lower quadrant. She denies any fevers, chills, nausea, vomiting, diarrhea, constipation. She is 11 years out from her last colonoscopy which was done for left lower quadrant pain.  Past Medical History  Diagnosis Date  . Diverticulitis   . Ovarian cyst     Past Surgical History  Procedure Laterality Date  . Ovarian cyst removal    . Colonoscopy  2006    Dr. Whitney PostMahsood  . Cesarean section  2009  . Tubal ligation  2012  . Laparoscopic oopherectomy Right 2015    Family History  Problem Relation Age of Onset  . Bradycardia Father     with Pacemaker  . Heart disease Maternal Grandmother   . Diabetes Maternal Grandmother     Social History:  reports that she has never smoked. She has never used smokeless tobacco. She reports that she drinks alcohol. She reports that she does not use illicit drugs.  Allergies: No Known Allergies  Medications reviewed.    ROS A multipoint review of systems was completed. All pertinent positives and negatives are documented within the history of present illness and remainder are negative   BP 133/83 mmHg  Pulse 92  Temp(Src) 98.3 F (36.8 C) (Oral)  Ht 5\' 2"  (1.575 m)  Wt 90.81 kg (200 lb 3.2 oz)  BMI 36.61 kg/m2  Physical Exam  Gen.: No acute distress Neck: Supple and nontender Chest: Clear to auscultation Heart: Regular rate and rhythm Abdomen: Soft, nondistended, minimally tender to deep palpation in left lower quadrant. No evidence of peritoneal signs, guarding,  rebound. Extremities: Moves all extremities well Psych: Appropriate   No results found for this or any previous visit (from the past 48 hour(s)). No results found.  Assessment/Plan:  1. Diverticulitis of colon with perforation 40 year old female with recent admission for sigmoid diverticulitis with microperforation. Continues to have some tenderness. Discussed with the patient the importance of her symptoms being completely gone before stopping antibiotics. A paper prescription for an additional course of antibiotics as provided today and the patient was instructed that if she is not completely free of symptoms that she is to fill the prescription and continue on antibiotics until she is symptom free. If that is the case she is to call clinic to be seen again in follow-up and to reschedule her already scheduled colonoscopy for mid July. Patient voiced understanding and will follow the aforementioned plan.     Ricarda Frameharles Mee Macdonnell, MD FACS General Surgeon  01/03/2016,11:11 AM

## 2016-02-14 NOTE — Discharge Instructions (Signed)

## 2016-02-17 ENCOUNTER — Encounter
Admission: RE | Disposition: A | Discharge status: Home / Self Care | Payer: Self-pay | Source: Ambulatory Visit | Attending: Gastroenterology

## 2016-02-17 ENCOUNTER — Ambulatory Visit: Payer: 59 | Admitting: Anesthesiology

## 2016-02-17 ENCOUNTER — Ambulatory Visit
Admission: RE | Admit: 2016-02-17 | Discharge: 2016-02-17 | Disposition: A | Discharge status: Home / Self Care | Payer: 59 | Source: Ambulatory Visit | Attending: Gastroenterology | Admitting: Gastroenterology

## 2016-02-17 DIAGNOSIS — K5750 Diverticulosis of both small and large intestine without perforation or abscess without bleeding: Secondary | ICD-10-CM | POA: Insufficient documentation

## 2016-02-17 DIAGNOSIS — K5732 Diverticulitis of large intestine without perforation or abscess without bleeding: Secondary | ICD-10-CM | POA: Diagnosis not present

## 2016-02-17 DIAGNOSIS — K640 First degree hemorrhoids: Secondary | ICD-10-CM | POA: Insufficient documentation

## 2016-02-17 DIAGNOSIS — K5792 Diverticulitis of intestine, part unspecified, without perforation or abscess without bleeding: Secondary | ICD-10-CM | POA: Diagnosis present

## 2016-02-17 HISTORY — PX: COLONOSCOPY WITH PROPOFOL: SHX5780

## 2016-02-17 SURGERY — COLONOSCOPY WITH PROPOFOL
Anesthesia: Monitor Anesthesia Care | Wound class: Contaminated

## 2016-02-17 MED ORDER — LIDOCAINE HCL (CARDIAC) 20 MG/ML IV SOLN
INTRAVENOUS | Status: DC | PRN
  Administered 2016-02-17: 40 mg via INTRAVENOUS

## 2016-02-17 MED ORDER — PROPOFOL 10 MG/ML IV BOLUS
INTRAVENOUS | Status: DC | PRN
  Administered 2016-02-17: 50 mg via INTRAVENOUS
  Administered 2016-02-17: 20 mg via INTRAVENOUS
  Administered 2016-02-17: 50 mg via INTRAVENOUS
  Administered 2016-02-17: 20 mg via INTRAVENOUS
  Administered 2016-02-17 (×3): 50 mg via INTRAVENOUS

## 2016-02-17 MED ORDER — STERILE WATER FOR IRRIGATION IR SOLN
Status: DC | PRN
  Administered 2016-02-17: 11:00:00

## 2016-02-17 MED ORDER — LACTATED RINGERS IV SOLN
INTRAVENOUS | Status: DC
  Administered 2016-02-17: 11:00:00 via INTRAVENOUS

## 2016-02-17 SURGICAL SUPPLY — 23 items
CANISTER SUCT 1200ML W/VALVE (MISCELLANEOUS) ×2 IMPLANT
CLIP HMST 235XBRD CATH ROT (MISCELLANEOUS) IMPLANT
CLIP RESOLUTION 360 11X235 (MISCELLANEOUS)
FCP ESCP3.2XJMB 240X2.8X (MISCELLANEOUS)
FORCEPS BIOP RAD 4 LRG CAP 4 (CUTTING FORCEPS) IMPLANT
FORCEPS BIOP RJ4 240 W/NDL (MISCELLANEOUS)
FORCEPS ESCP3.2XJMB 240X2.8X (MISCELLANEOUS) IMPLANT
GOWN CVR UNV OPN BCK APRN NK (MISCELLANEOUS) ×2 IMPLANT
GOWN ISOL THUMB LOOP REG UNIV (MISCELLANEOUS) ×2
INJECTOR VARIJECT VIN23 (MISCELLANEOUS) IMPLANT
KIT DEFENDO VALVE AND CONN (KITS) IMPLANT
KIT ENDO PROCEDURE OLY (KITS) ×2 IMPLANT
MARKER SPOT ENDO TATTOO 5ML (MISCELLANEOUS) IMPLANT
PAD GROUND ADULT SPLIT (MISCELLANEOUS) IMPLANT
PROBE APC STR FIRE (PROBE) IMPLANT
RETRIEVER NET ROTH 2.5X230 LF (MISCELLANEOUS) ×2 IMPLANT
SNARE SHORT THROW 13M SML OVAL (MISCELLANEOUS) IMPLANT
SNARE SHORT THROW 30M LRG OVAL (MISCELLANEOUS) IMPLANT
SNARE SNG USE RND 15MM (INSTRUMENTS) IMPLANT
SPOT EX ENDOSCOPIC TATTOO (MISCELLANEOUS)
TRAP ETRAP POLY (MISCELLANEOUS) IMPLANT
VARIJECT INJECTOR VIN23 (MISCELLANEOUS)
WATER STERILE IRR 250ML POUR (IV SOLUTION) ×2 IMPLANT

## 2016-02-17 NOTE — Anesthesia Postprocedure Evaluation (Signed)
Anesthesia Post Note  Patient: Sherri SalmShirley Avila  Procedure(s) Performed: Procedure(s) (LRB): COLONOSCOPY WITH PROPOFOL (N/A)  Patient location during evaluation: PACU Anesthesia Type: General Level of consciousness: awake and alert Pain management: pain level controlled Vital Signs Assessment: post-procedure vital signs reviewed and stable Respiratory status: spontaneous breathing, nonlabored ventilation, respiratory function stable and patient connected to nasal cannula oxygen Cardiovascular status: blood pressure returned to baseline and stable Postop Assessment: no signs of nausea or vomiting Anesthetic complications: no    Dorene GrebeMcCulloch, Ajani Rineer V

## 2016-02-17 NOTE — H&P (Signed)
  Lucilla Lame, MD [facility] [address] [place], [place] [address] Phone: [phone] Fax : [phone]  Primary Care Physician:  [place] Primary Gastroenterologist:  Dr. Allen Norris  Pre-Procedure History & Physical: HPI:  Sherri Avila is a 40 y.o. female is here for an colonoscopy.   Past Medical History  Diagnosis Date  . Diverticulitis   . Ovarian cyst     Past Surgical History  Procedure Laterality Date  . Ovarian cyst removal    . Colonoscopy  2006    Dr. Kerin Salen  . Cesarean section  2009  . Tubal ligation  2012  . Laparoscopic oopherectomy Right 2015    Prior to Admission medications   Medication Sig Start Date End Date Taking? Authorizing Provider  Na Sulfate-K Sulfate-Mg Sulf (SUPREP BOWEL PREP KIT) 17.5-3.13-1.6 GM/180ML SOLN Take 1 kit by mouth as directed. 01/03/16  Yes Lucilla Lame, MD  amoxicillin-clavulanate (AUGMENTIN) 875-125 MG tablet Take 1 tablet by mouth 2 (two) times daily. Patient not taking: Reported on 02/07/2016 01/03/16   Clayburn Pert, MD    Allergies as of 01/03/2016  . (No Known Allergies)    Family History  Problem Relation Age of Onset  . Bradycardia Father     with Pacemaker  . Heart disease Maternal Grandmother   . Diabetes Maternal Grandmother     Social History   Social History  . Marital Status: Married    Spouse Name: N/A  . Number of Children: N/A  . Years of Education: N/A   Occupational History  . Not on file.   Social History Main Topics  . Smoking status: Never Smoker   . Smokeless tobacco: Never Used  . Alcohol Use: No     Comment: Occasional  . Drug Use: No  . Sexual Activity: Not on file   Other Topics Concern  . Not on file   Social History Narrative    Review of Systems: See HPI, otherwise negative ROS  Physical Exam: BP 116/76 mmHg  Pulse 74  Temp(Src) 96.6 F (35.9 C) (Temporal)  Resp 16  Ht '5\' 2"'$  (1.575 m)  Wt 196 lb (88.905 kg)  BMI 35.84 kg/m2  SpO2 100%  LMP  01/02/2016 General:   Alert,  pleasant and cooperative in NAD Head:  Normocephalic and atraumatic. Neck:  Supple; no masses or thyromegaly. Lungs:  Clear throughout to auscultation.    Heart:  Regular rate and rhythm. Abdomen:  Soft, nontender and nondistended. Normal bowel sounds, without guarding, and without rebound.   Neurologic:  Alert and  oriented x4;  grossly normal neurologically.  Impression/Plan: Sherri Avila is here for an colonoscopy to be performed for diverticulitis  Risks, benefits, limitations, and alternatives regarding  colonoscopy have been reviewed with the patient.  Questions have been answered.  All parties agreeable.   Lucilla Lame, MD  02/17/2016, 10:13 AM

## 2016-02-17 NOTE — Op Note (Signed)
[facility] Patient Name: Sherri Avila Procedure Date: 02/17/2016 10:50 AM MRN: [id number] Account #: [id number] Date of Birth: 23-Jun-1976 Admit Type: Outpatient Age: 40 Room: [facility] OR ROOM 01 Gender: Female Note Status: Finalized Procedure:            Colonoscopy Indications:          Follow-up of diverticulitis Providers:            Midge Minium MD, MD Referring MD:         [facility] ***Heber [place], MD (Referring                        MD) Medicines:            Propofol per Anesthesia Complications:        No immediate complications. Procedure:            Pre-Anesthesia Assessment:                       - Prior to the procedure, a History and Physical was                        performed, and patient medications and allergies were                        reviewed. The patient's tolerance of previous                        anesthesia was also reviewed. The risks and benefits of                        the procedure and the sedation options and risks were                        discussed with the patient. All questions were                        answered, and informed consent was obtained. Prior                        Anticoagulants: The patient has taken no previous                        anticoagulant or antiplatelet agents. ASA Grade                        Assessment: II - A patient with mild systemic disease.                        After reviewing the risks and benefits, the patient was                        deemed in satisfactory condition to undergo the                        procedure.                       After obtaining informed consent, the colonoscope was  passed under direct vision. Throughout the procedure,                        the patient's blood pressure, pulse, and oxygen                        saturations were monitored continuously. The Olympus CF                        H180AL colonoscope  (S#: [id number]) was introduced through                        the anus and advanced to the the cecum, identified by                        appendiceal orifice and ileocecal valve. The                        colonoscopy was performed without difficulty. The                        patient tolerated the procedure well. The quality of                        the bowel preparation was excellent. Findings:      The perianal and digital rectal examinations were normal.      Multiple small-mouthed diverticula were found in the entire colon.      Non-bleeding internal hemorrhoids were found during retroflexion. The       hemorrhoids were Grade I (internal hemorrhoids that do not prolapse).      The majority of the diverticuli were on the left with a few on the right. Impression:           - Diverticulosis in the entire examined colon.                       - Non-bleeding internal hemorrhoids.                       - The majority of the diverticuli were on the left with                        a few on the right.                       - No specimens collected. Recommendation:       - Repeat colonoscopy in 10 years for screening unless                        any change in family history or lower GI problems. Procedure Code(s):    --- Professional ---                       [id number], Colonoscopy, flexible; diagnostic, including                        collection of specimen(s) by brushing or washing, when                        performed (separate procedure) Diagnosis Code(s):    ---  Professional ---                       Z61.09, Diverticulitis of large intestine without                        perforation or abscess without bleeding CPT copyright 2016 American Medical Association. All rights reserved. The codes documented in this report are preliminary and upon coder review may  be revised to meet current compliance requirements. Midge Minium MD, MD 02/17/2016 11:18:49 AM This report has been signed  electronically. Number of Addenda: 0 Note Initiated On: 02/17/2016 10:50 AM Scope Withdrawal Time: 0 hours 6 minutes 6 seconds  Total Procedure Duration: 0 hours 12 minutes 16 seconds       [facility]

## 2016-02-17 NOTE — Anesthesia Preprocedure Evaluation (Signed)
Anesthesia Evaluation  Patient identified by MRN, date of birth, ID band  Airway Mallampati: II  TM Distance: >3 FB Neck ROM: Full    Dental   Pulmonary    Pulmonary exam normal        Cardiovascular Normal cardiovascular exam     Neuro/Psych    GI/Hepatic Diverticular disease   Endo/Other    Renal/GU      Musculoskeletal   Abdominal   Peds  Hematology   Anesthesia Other Findings   Reproductive/Obstetrics                             Anesthesia Physical Anesthesia Plan  ASA: II  Anesthesia Plan: MAC   Post-op Pain Management:    Induction:   Airway Management Planned:   Additional Equipment:   Intra-op Plan:   Post-operative Plan:   Informed Consent: I have reviewed the patients History and Physical, chart, labs and discussed the procedure including the risks, benefits and alternatives for the proposed anesthesia with the patient or authorized representative who has indicated his/her understanding and acceptance.     Plan Discussed with: CRNA  Anesthesia Plan Comments:         Anesthesia Quick Evaluation

## 2016-02-17 NOTE — Transfer of Care (Signed)
Immediate Anesthesia Transfer of Care Note  Patient: Sherri Avila  Procedure(s) Performed: Procedure(s): COLONOSCOPY WITH PROPOFOL (N/A)  Patient Location: PACU  Anesthesia Type: MAC  Level of Consciousness: awake, alert  and patient cooperative  Airway and Oxygen Therapy: Patient Spontanous Breathing and Patient connected to supplemental oxygen  Post-op Assessment: Post-op Vital signs reviewed, Patient's Cardiovascular Status Stable, Respiratory Function Stable, Patent Airway and No signs of Nausea or vomiting  Post-op Vital Signs: Reviewed and stable  Complications: No apparent anesthesia complications

## 2016-02-18 ENCOUNTER — Encounter: Payer: Self-pay | Admitting: Gastroenterology

## 2016-02-18 ENCOUNTER — Other Ambulatory Visit: Payer: Self-pay

## 2016-02-20 ENCOUNTER — Encounter: Payer: Self-pay | Admitting: General Surgery

## 2016-02-20 ENCOUNTER — Ambulatory Visit (INDEPENDENT_AMBULATORY_CARE_PROVIDER_SITE_OTHER): Payer: 59 | Admitting: General Surgery

## 2016-02-20 VITALS — BP 129/83 | HR 74 | Temp 98.3°F | Wt 199.0 lb

## 2016-02-20 DIAGNOSIS — K5730 Diverticulosis of large intestine without perforation or abscess without bleeding: Secondary | ICD-10-CM

## 2016-02-20 NOTE — Progress Notes (Signed)
Outpatient Surgical Follow Up  02/20/2016  Sherri SalmShirley Paczkowski is an 40 y.o. female.   Chief Complaint  Patient presents with  . Follow-up    Diverticulitis    HPI: 40 year old female with recent history of diverticulitis presents to clinic for follow-up after colonoscopy earlier this week. Colonoscopy showed evidence of diverticulosis throughout the entirety of her colon. Patient states that she tolerated the colonoscopy well and is otherwise done well recently. She did have another subacute bout of diverticulitis that was treated with antibiotics and outpatient setting last month. She currently denies any fevers, chills, nausea, vomiting, chest pain, shortness of breath, diarrhea, constipation.  Past Medical History  Diagnosis Date  . Diverticulitis   . Ovarian cyst   . Diverticulitis     Past Surgical History  Procedure Laterality Date  . Ovarian cyst removal    . Colonoscopy  2006    Dr. Whitney PostMahsood  . Cesarean section  2009  . Tubal ligation  2012  . Laparoscopic oopherectomy Right 2015  . Colonoscopy with propofol N/A 02/17/2016    Procedure: COLONOSCOPY WITH PROPOFOL;  Surgeon: Midge Miniumarren Wohl, MD;  [facility];  Service: Endoscopy;  Laterality: N/A;    Family History  Problem Relation Age of Onset  . Bradycardia Father     with Pacemaker  . Heart disease Maternal Grandmother   . Diabetes Maternal Grandmother     Social History:  reports that she has never smoked. She has never used smokeless tobacco. She reports that she does not drink alcohol or use illicit drugs.  Allergies: No Known Allergies  Medications reviewed.    ROS  A multipoint review of systems was completed. All pertinent positives and negatives are documented within the history of present illness and remainder are negative.  BP 129/83 mmHg  Pulse 74  Temp(Src) 98.3 F (36.8 C) (Oral)  Wt 90.266 kg (199 lb)  LMP 01/02/2016  Physical Exam Gen.: No acute distress Neck: Supple and  nontender Chest: Clear to auscultation Heart: Regular rate and rhythm Abdomen: Soft, nontender, nondistended Extremities: Moves all extremities well.    No results found for this or any previous visit (from the past 48 hour(s)). No results found.  Assessment/Plan:  1. Diverticulosis of colon without hemorrhage 40 year old female with history of recurrent diverticulitis and evidence of pandiverticulosis on recent colonoscopy. Discussed her treatment options in detail to include the option of a sigmoid colectomy. Discussed at length that this could decrease the risk of her having a recurrent bout of diverticulitis. Also discussed that recurrent diverticulitis tends to become worse and worse over time which could eventually lead to her needing emergent surgery with a likely end colostomy. Patient stated that despite all this she needed to discuss with her family prior to deciding whether or not she wants surgery. She'll call back to clinic to either schedule a follow-up or discuss surgery dates. Again described the symptoms of diverticulitis and to seek medical attention immediately should they occur. Total time for this encounter was 15 minutes.     Ricarda Frameharles Aaryn Sermon, MD FACS General Surgeon  02/20/2016,12:17 PM

## 2016-02-20 NOTE — Patient Instructions (Addendum)
Diverticulitis °Diverticulitis is inflammation or infection of small pouches in your colon that form when you have a condition called diverticulosis. The pouches in your colon are called diverticula. Your colon, or large intestine, is where water is absorbed and stool is formed. °Complications of diverticulitis can include: °· Bleeding. °· Severe infection. °· Severe pain. °· Perforation of your colon. °· Obstruction of your colon. °CAUSES  °Diverticulitis is caused by bacteria. °Diverticulitis happens when stool becomes trapped in diverticula. This allows bacteria to grow in the diverticula, which can lead to inflammation and infection. °RISK FACTORS °People with diverticulosis are at risk for diverticulitis. Eating a diet that does not include enough fiber from fruits and vegetables may make diverticulitis more likely to develop. °SYMPTOMS  °Symptoms of diverticulitis may include: °· Abdominal pain and tenderness. The pain is normally located on the left side of the abdomen, but may occur in other areas. °· Fever and chills. °· Bloating. °· Cramping. °· Nausea. °· Vomiting. °· Constipation. °· Diarrhea. °· Blood in your stool. °DIAGNOSIS  °Your health care provider will ask you about your medical history and do a physical exam. You may need to have tests done because many medical conditions can cause the same symptoms as diverticulitis. Tests may include: °· Blood tests. °· Urine tests. °· Imaging tests of the abdomen, including X-rays and CT scans. °When your condition is under control, your health care provider may recommend that you have a colonoscopy. A colonoscopy can show how severe your diverticula are and whether something else is causing your symptoms. °TREATMENT  °Most cases of diverticulitis are mild and can be treated at home. Treatment may include: °· Taking over-the-counter pain medicines. °· Following a clear liquid diet. °· Taking antibiotic medicines by mouth for 7-10 days. °More severe cases may  be treated at a hospital. Treatment may include: °· Not eating or drinking. °· Taking prescription pain medicine. °· Receiving antibiotic medicines through an IV tube. °· Receiving fluids and nutrition through an IV tube. °· Surgery. °HOME CARE INSTRUCTIONS  °· Follow your health care provider's instructions carefully. °· Follow a full liquid diet or other diet as directed by your health care provider. After your symptoms improve, your health care provider may tell you to change your diet. He or she may recommend you eat a high-fiber diet. Fruits and vegetables are good sources of fiber. Fiber makes it easier to pass stool. °· Take fiber supplements or probiotics as directed by your health care provider. °· Only take medicines as directed by your health care provider. °· Keep all your follow-up appointments. °SEEK MEDICAL CARE IF:  °· Your pain does not improve. °· You have a hard time eating food. °· Your bowel movements do not return to normal. °SEEK IMMEDIATE MEDICAL CARE IF:  °· Your pain becomes worse. °· Your symptoms do not get better. °· Your symptoms suddenly get worse. °· You have a fever. °· You have repeated vomiting. °· You have bloody or black, tarry stools. °MAKE SURE YOU:  °· Understand these instructions. °· Will watch your condition. °· Will get help right away if you are not doing well or get worse. °  °This information is not intended to replace advice given to you by your health care provider. Make sure you discuss any questions you have with your health care provider. °  °Document Released: 04/29/2005 Document Revised: 07/25/2013 Document Reviewed: 06/14/2013 °Elsevier Interactive Patient Education ©2016 Elsevier Inc. ° °

## 2016-02-21 ENCOUNTER — Telehealth: Payer: Self-pay | Admitting: General Surgery

## 2016-02-21 NOTE — Telephone Encounter (Signed)
Patient saw Dr Tonita CongWoodham in office 7/20 regarding recurrent diverticulitis and evidence of pandiverticulosis on recent colonoscopy. After discussing findings with her family, she is now ready to schedule surgery.

## 2016-02-27 ENCOUNTER — Other Ambulatory Visit: Payer: Self-pay | Admitting: General Surgery

## 2016-02-27 DIAGNOSIS — K5732 Diverticulitis of large intestine without perforation or abscess without bleeding: Secondary | ICD-10-CM

## 2016-02-27 MED ORDER — POLYETHYLENE GLYCOL 3350 17 GM/SCOOP PO POWD: 1.0000 | Freq: Once | ORAL | 0 refills | Status: DC

## 2016-02-27 MED ORDER — BISACODYL EC 5 MG PO TBEC: DELAYED_RELEASE_TABLET | ORAL | 0 refills | Status: DC

## 2016-02-27 MED ORDER — NEOMYCIN SULFATE 500 MG PO TABS: 500.0000 mg | ORAL_TABLET | Freq: Three times a day (TID) | ORAL | 0 refills | Status: DC

## 2016-02-27 MED ORDER — ERYTHROMYCIN BASE 500 MG PO TABS: 500.0000 mg | ORAL_TABLET | Freq: Three times a day (TID) | ORAL | 0 refills | Status: DC

## 2016-02-27 NOTE — Telephone Encounter (Signed)
Pt advised of pre op date/time and sx date. Sx: 03/05/16--Dr Woodham--Laparoscopic sigmoid colectomy.  Pre op: 02/28/16 @ 1:00pm--office.   Patient made aware to call [phone], between 1-3:00pm the day before surgery, to find out what time to arrive.     Patient has been advised to come to the [facility] office to obtain instructions of Bowel Prep after her pre admit appointment on 02/28/16.

## 2016-02-27 NOTE — Telephone Encounter (Signed)
Dulcolax and Miralax have been sent to patient's pharmacy ([facility]). Patient will come in tomorrow to pick up her colon prep. I will also give her the blue sheet so she could obtain the number to call the day before surgery to find out at what time she should show up for surgery.

## 2016-02-27 NOTE — Telephone Encounter (Signed)
Antibiotics were e-scribed to her pharmacy with instructions.

## 2016-02-28 ENCOUNTER — Other Ambulatory Visit: Payer: Self-pay | Admitting: General Surgery

## 2016-02-28 ENCOUNTER — Encounter
Admission: RE | Admit: 2016-02-28 | Discharge: 2016-02-28 | Disposition: A | Discharge status: Home / Self Care | Payer: 59 | Source: Ambulatory Visit | Attending: General Surgery | Admitting: General Surgery

## 2016-02-28 ENCOUNTER — Telehealth: Payer: Self-pay

## 2016-02-28 DIAGNOSIS — Z01812 Encounter for preprocedural laboratory examination: Secondary | ICD-10-CM | POA: Diagnosis present

## 2016-02-28 DIAGNOSIS — K5730 Diverticulosis of large intestine without perforation or abscess without bleeding: Secondary | ICD-10-CM

## 2016-02-28 LAB — COMPREHENSIVE METABOLIC PANEL
ALBUMIN: 4.1 g/dL (ref 3.5–5.0)
ALT: 20 U/L (ref 14–54)
AST: 21 U/L (ref 15–41)
Alkaline Phosphatase: 54 U/L (ref 38–126)
Anion gap: 7 (ref 5–15)
BILIRUBIN TOTAL: 0.6 mg/dL (ref 0.3–1.2)
BUN: 14 mg/dL (ref 6–20)
CHLORIDE: 102 mmol/L (ref 101–111)
CO2: 26 mmol/L (ref 22–32)
CREATININE: 0.57 mg/dL (ref 0.44–1.00)
Calcium: 9.4 mg/dL (ref 8.9–10.3)
GFR calc Af Amer: >60 mL/min (ref >60)
GLUCOSE: 78 mg/dL (ref 65–99)
Potassium: 4 mmol/L (ref 3.5–5.1)
Sodium: 135 mmol/L (ref 135–145)
Total Protein: 8.1 g/dL (ref 6.5–8.1)

## 2016-02-28 LAB — CBC WITH DIFFERENTIAL/PLATELET
BASOS ABS: 0.1 10*3/uL (ref 0–0.1)
Basophils Relative: 1 %
Eosinophils Absolute: 0.4 10*3/uL (ref 0–0.7)
Eosinophils Relative: 3 %
HEMATOCRIT: 40.9 % (ref 35.0–47.0)
Hemoglobin: 13.6 g/dL (ref 12.0–16.0)
LYMPHS PCT: 23 %
Lymphs Abs: 2.9 10*3/uL (ref 1.0–3.6)
MCH: 28.4 pg (ref 26.0–34.0)
MCHC: 33.2 g/dL (ref 32.0–36.0)
MCV: 85.4 fL (ref 80.0–100.0)
Monocytes Absolute: 0.6 10*3/uL (ref 0.2–0.9)
Monocytes Relative: 5 %
NEUTROS ABS: 8.8 10*3/uL — AB (ref 1.4–6.5)
Neutrophils Relative %: 68 %
PLATELETS: 280 10*3/uL (ref 150–440)
RBC: 4.79 MIL/uL (ref 3.80–5.20)
RDW: 14.5 % (ref 11.5–14.5)
WBC: 12.9 10*3/uL — AB (ref 3.6–11.0)

## 2016-02-28 LAB — PROTIME-INR
INR: 0.94
PROTHROMBIN TIME: 12.6 s (ref 11.4–15.2)

## 2016-02-28 LAB — SURGICAL PCR SCREEN
MRSA, PCR: NEGATIVE
Staphylococcus aureus: NEGATIVE

## 2016-02-28 LAB — APTT: APTT: 32 s (ref 24–36)

## 2016-02-28 NOTE — Telephone Encounter (Signed)
Patient came in and I gave her the instructions for her prep before her surgery. Prescriptions were sent to her [facility] pharmacy. Patient understood and had no further questions.

## 2016-02-28 NOTE — Patient Instructions (Signed)
Your procedure is scheduled on: Thursday 03/05/16 Report to Day Surgery. 2ND FLOOR MEDICAL MALL ENTRANCE To find out your arrival time please call [phone] between 1PM - 3PM on Wednesday 03/04/16.  Remember: Instructions that are not followed completely may result in serious medical risk, up to and including death, or upon the discretion of your surgeon and anesthesiologist your surgery may need to be rescheduled.    __X__ 1. Do not eat food or drink liquids after midnight. No gum chewing or hard candies.     __X__ 2. No Alcohol for 24 hours before or after surgery.   ____ 3. Bring all medications with you on the day of surgery if instructed.    __X__ 4. Notify your doctor if there is any change in your medical condition     (cold, fever, infections).     Do not wear jewelry, make-up, hairpins, clips or nail polish.  Do not wear lotions, powders, or perfumes.   Do not shave 48 hours prior to surgery. Men may shave face and neck.  Do not bring valuables to the hospital.    [facility] is not responsible for any belongings or valuables.               Contacts, dentures or bridgework may not be worn into surgery.  Leave your suitcase in the car. After surgery it may be brought to your room.  For patients admitted to the hospital, discharge time is determined by your                treatment team.   Patients discharged the day of surgery will not be allowed to drive home.   Please read over the following fact sheets that you were given:   MRSA Information and Surgical Site Infection Prevention   ____ Take these medicines the morning of surgery with A SIP OF WATER:    1. NONE  2.   3.   4.  5.  6.  ____ Fleet Enema (as directed)   __X__ Use CHG Soap as directed  ____ Use inhalers on the day of surgery  ____ Stop metformin 2 days prior to surgery    ____ Take 1/2 of usual insulin dose the night before surgery and none on the morning of surgery.   ____ Stop  Coumadin/Plavix/aspirin on   ____ Stop Anti-inflammatories on    ____ Stop supplements until after surgery.    ____ Bring C-Pap to the hospital.

## 2016-03-05 ENCOUNTER — Inpatient Hospital Stay: Payer: 59 | Admitting: Anesthesiology

## 2016-03-05 ENCOUNTER — Encounter
Admission: RE | Disposition: A | Discharge status: Home / Self Care | Payer: Self-pay | Source: Ambulatory Visit | Attending: General Surgery

## 2016-03-05 ENCOUNTER — Encounter: Payer: Self-pay | Admitting: *Deleted

## 2016-03-05 ENCOUNTER — Inpatient Hospital Stay
Admission: RE | Admit: 2016-03-05 | Discharge: 2016-03-07 | DRG: 330 | Disposition: A | Discharge status: Home / Self Care | Payer: 59 | Source: Ambulatory Visit | Attending: General Surgery | Admitting: General Surgery

## 2016-03-05 DIAGNOSIS — K625 Hemorrhage of anus and rectum: Secondary | ICD-10-CM | POA: Diagnosis present

## 2016-03-05 DIAGNOSIS — Z833 Family history of diabetes mellitus: Secondary | ICD-10-CM

## 2016-03-05 DIAGNOSIS — Z8249 Family history of ischemic heart disease and other diseases of the circulatory system: Secondary | ICD-10-CM

## 2016-03-05 DIAGNOSIS — Z9889 Other specified postprocedural states: Secondary | ICD-10-CM | POA: Diagnosis not present

## 2016-03-05 DIAGNOSIS — Z8719 Personal history of other diseases of the digestive system: Secondary | ICD-10-CM

## 2016-03-05 DIAGNOSIS — K5732 Diverticulitis of large intestine without perforation or abscess without bleeding: Secondary | ICD-10-CM | POA: Diagnosis present

## 2016-03-05 DIAGNOSIS — Z9049 Acquired absence of other specified parts of digestive tract: Secondary | ICD-10-CM

## 2016-03-05 DIAGNOSIS — Z9851 Tubal ligation status: Secondary | ICD-10-CM | POA: Diagnosis not present

## 2016-03-05 HISTORY — PX: LAPAROSCOPIC SIGMOID COLECTOMY: SHX5928

## 2016-03-05 LAB — CBC
HCT: 37.1 % (ref 35.0–47.0)
HEMOGLOBIN: 12.6 g/dL (ref 12.0–16.0)
MCH: 29 pg (ref 26.0–34.0)
MCHC: 33.9 g/dL (ref 32.0–36.0)
MCV: 85.4 fL (ref 80.0–100.0)
PLATELETS: 250 10*3/uL (ref 150–440)
RBC: 4.34 MIL/uL (ref 3.80–5.20)
RDW: 14.6 % — ABNORMAL HIGH (ref 11.5–14.5)
WBC: 15.8 10*3/uL — AB (ref 3.6–11.0)

## 2016-03-05 LAB — CREATININE, SERUM: CREATININE: 0.64 mg/dL (ref 0.44–1.00)

## 2016-03-05 LAB — POCT PREGNANCY, URINE: PREG TEST UR: NEGATIVE

## 2016-03-05 SURGERY — COLECTOMY, SIGMOID, LAPAROSCOPIC
Anesthesia: General

## 2016-03-05 MED ORDER — LIDOCAINE HCL (PF) 1 % IJ SOLN
INTRAMUSCULAR | Status: DC | PRN
  Administered 2016-03-05: 5 mL

## 2016-03-05 MED ORDER — MIDAZOLAM HCL 2 MG/2ML IJ SOLN
INTRAMUSCULAR | Status: DC | PRN
Start: 2016-03-05 — End: 2016-03-05
  Administered 2016-03-05: 2 mg via INTRAVENOUS

## 2016-03-05 MED ORDER — ONDANSETRON HCL 4 MG/2ML IJ SOLN: 4.0000 mg | Freq: Once | INTRAMUSCULAR | Status: DC | PRN

## 2016-03-05 MED ORDER — FENTANYL CITRATE (PF) 100 MCG/2ML IJ SOLN
INTRAMUSCULAR | Status: DC | PRN
  Administered 2016-03-05 (×3): 250 ug via INTRAVENOUS

## 2016-03-05 MED ORDER — FENTANYL CITRATE (PF) 100 MCG/2ML IJ SOLN
25.0000 ug | INTRAMUSCULAR | Status: DC | PRN
  Administered 2016-03-05 (×4): 25 ug via INTRAVENOUS

## 2016-03-05 MED ORDER — DIPHENHYDRAMINE HCL 50 MG/ML IJ SOLN: 25.0000 mg | Freq: Four times a day (QID) | INTRAMUSCULAR | Status: DC | PRN

## 2016-03-05 MED ORDER — BUPIVACAINE HCL (PF) 0.5 % IJ SOLN
INTRAMUSCULAR | Status: DC | PRN
  Administered 2016-03-05: 5 mL

## 2016-03-05 MED ORDER — ONDANSETRON 4 MG PO TBDP: 4.0000 mg | ORAL_TABLET | Freq: Four times a day (QID) | ORAL | Status: DC | PRN

## 2016-03-05 MED ORDER — HYDROCODONE-ACETAMINOPHEN 5-325 MG PO TABS
1.0000 | ORAL_TABLET | ORAL | Status: DC | PRN
  Administered 2016-03-06 – 2016-03-07 (×6): 2 via ORAL
  Filled 2016-03-05 (×6): qty 2
  Filled 2016-03-05: qty 1

## 2016-03-05 MED ORDER — BUPIVACAINE HCL (PF) 0.5 % IJ SOLN
INTRAMUSCULAR | Status: AC
  Filled 2016-03-05: qty 30

## 2016-03-05 MED ORDER — FENTANYL CITRATE (PF) 100 MCG/2ML IJ SOLN
INTRAMUSCULAR | Status: AC
  Filled 2016-03-05: qty 2

## 2016-03-05 MED ORDER — CHLORHEXIDINE GLUCONATE CLOTH 2 % EX PADS: 6.0000 | MEDICATED_PAD | Freq: Once | CUTANEOUS | Status: DC

## 2016-03-05 MED ORDER — ACETAMINOPHEN 10 MG/ML IV SOLN
INTRAVENOUS | Status: DC | PRN
  Administered 2016-03-05: 1000 mg via INTRAVENOUS

## 2016-03-05 MED ORDER — ACETAMINOPHEN 10 MG/ML IV SOLN
INTRAVENOUS | Status: AC
  Filled 2016-03-05: qty 100

## 2016-03-05 MED ORDER — NEOSTIGMINE METHYLSULFATE 10 MG/10ML IV SOLN
INTRAVENOUS | Status: DC | PRN
  Administered 2016-03-05: 3 mg via INTRAVENOUS

## 2016-03-05 MED ORDER — DIPHENHYDRAMINE HCL 25 MG PO CAPS: 25.0000 mg | ORAL_CAPSULE | Freq: Four times a day (QID) | ORAL | Status: DC | PRN

## 2016-03-05 MED ORDER — DEXAMETHASONE SODIUM PHOSPHATE 4 MG/ML IJ SOLN
INTRAMUSCULAR | Status: DC | PRN
  Administered 2016-03-05: 5 mg via INTRAVENOUS

## 2016-03-05 MED ORDER — GLYCOPYRROLATE 0.2 MG/ML IJ SOLN
INTRAMUSCULAR | Status: DC | PRN
  Administered 2016-03-05: 0.4 mg via INTRAVENOUS

## 2016-03-05 MED ORDER — LIDOCAINE HCL (CARDIAC) 20 MG/ML IV SOLN
INTRAVENOUS | Status: DC | PRN
  Administered 2016-03-05: 100 mg via INTRAVENOUS

## 2016-03-05 MED ORDER — ONDANSETRON HCL 4 MG/2ML IJ SOLN
4.0000 mg | Freq: Four times a day (QID) | INTRAMUSCULAR | Status: DC | PRN
  Administered 2016-03-05: 4 mg via INTRAVENOUS
  Filled 2016-03-05: qty 2

## 2016-03-05 MED ORDER — MORPHINE SULFATE (PF) 2 MG/ML IV SOLN
2.0000 mg | INTRAVENOUS | Status: DC | PRN
  Administered 2016-03-05 – 2016-03-06 (×2): 2 mg via INTRAVENOUS
  Filled 2016-03-05 (×2): qty 1

## 2016-03-05 MED ORDER — NALOXONE HCL 0.4 MG/ML IJ SOLN
INTRAMUSCULAR | Status: DC | PRN
  Administered 2016-03-05: 80 ug via INTRAVENOUS

## 2016-03-05 MED ORDER — LACTATED RINGERS IV SOLN
INTRAVENOUS | Status: DC
  Administered 2016-03-05: 1000 mL via INTRAVENOUS
  Administered 2016-03-06 (×2): via INTRAVENOUS

## 2016-03-05 MED ORDER — LIDOCAINE HCL (PF) 1 % IJ SOLN
INTRAMUSCULAR | Status: AC
  Filled 2016-03-05: qty 30

## 2016-03-05 MED ORDER — FENTANYL CITRATE (PF) 100 MCG/2ML IJ SOLN
25.0000 ug | INTRAMUSCULAR | Status: DC | PRN
  Administered 2016-03-05 (×2): 25 ug via INTRAVENOUS

## 2016-03-05 MED ORDER — MORPHINE SULFATE (PF) 4 MG/ML IV SOLN
4.0000 mg | INTRAVENOUS | Status: DC | PRN
  Administered 2016-03-05: 2 mg via INTRAVENOUS
  Filled 2016-03-05: qty 1

## 2016-03-05 MED ORDER — FAMOTIDINE 20 MG PO TABS
ORAL_TABLET | ORAL | Status: AC
  Administered 2016-03-05: 20 mg via ORAL
  Filled 2016-03-05: qty 1

## 2016-03-05 MED ORDER — ROCURONIUM BROMIDE 100 MG/10ML IV SOLN
INTRAVENOUS | Status: DC | PRN
  Administered 2016-03-05: 50 mg via INTRAVENOUS
  Administered 2016-03-05: 20 mg via INTRAVENOUS

## 2016-03-05 MED ORDER — HYDRALAZINE HCL 20 MG/ML IJ SOLN: 10.0000 mg | INTRAMUSCULAR | Status: DC | PRN

## 2016-03-05 MED ORDER — PROPOFOL 10 MG/ML IV BOLUS
INTRAVENOUS | Status: DC | PRN
  Administered 2016-03-05: 250 mg via INTRAVENOUS

## 2016-03-05 MED ORDER — FAMOTIDINE 20 MG PO TABS
20.0000 mg | ORAL_TABLET | Freq: Once | ORAL | Status: AC
  Administered 2016-03-05: 20 mg via ORAL

## 2016-03-05 MED ORDER — SODIUM CHLORIDE 0.9 % IV SOLN
1.0000 g | INTRAVENOUS | Status: AC
  Administered 2016-03-05: 1 g via INTRAVENOUS
  Filled 2016-03-05: qty 1

## 2016-03-05 MED ORDER — LACTATED RINGERS IV SOLN
INTRAVENOUS | Status: DC
  Administered 2016-03-05 (×3): via INTRAVENOUS

## 2016-03-05 MED ORDER — ENOXAPARIN SODIUM 40 MG/0.4ML ~~LOC~~ SOLN
40.0000 mg | SUBCUTANEOUS | Status: DC
  Administered 2016-03-06 – 2016-03-07 (×2): 40 mg via SUBCUTANEOUS
  Filled 2016-03-05 (×2): qty 0.4

## 2016-03-05 SURGICAL SUPPLY — 63 items
ADHESIVE MASTISOL STRL (MISCELLANEOUS) ×2 IMPLANT
BLADE SURG SZ10 CARB STEEL (BLADE) ×2 IMPLANT
CANISTER SUCT 1200ML W/VALVE (MISCELLANEOUS) ×2 IMPLANT
CATH TRAY 16F METER LATEX (MISCELLANEOUS) ×2 IMPLANT
CHLORAPREP W/TINT 26ML (MISCELLANEOUS) ×2 IMPLANT
CLIP TI LARGE 6 (CLIP) IMPLANT
CLIP TI MEDIUM 6 (CLIP) IMPLANT
DEFOGGER SCOPE WARMER CLEARIFY (MISCELLANEOUS) ×2 IMPLANT
DRAPE LEGGINS SURG 28X43 STRL (DRAPES) ×2 IMPLANT
DRSG OPSITE POSTOP 3X4 (GAUZE/BANDAGES/DRESSINGS) ×6 IMPLANT
DRSG OPSITE POSTOP 4X6 (GAUZE/BANDAGES/DRESSINGS) ×2 IMPLANT
ELECT BLADE 6.5 EXT (BLADE) ×2 IMPLANT
ELECT CAUTERY BLADE 6.4 (BLADE) ×4 IMPLANT
ELECT REM PT RETURN 9FT ADLT (ELECTROSURGICAL) ×2
ELECTRODE REM PT RTRN 9FT ADLT (ELECTROSURGICAL) ×1 IMPLANT
GAUZE SPONGE 4X4 12PLY STRL (GAUZE/BANDAGES/DRESSINGS) IMPLANT
GLOVE BIO SURGEON STRL SZ7.5 (GLOVE) ×6 IMPLANT
GLOVE INDICATOR 8.0 STRL GRN (GLOVE) ×4 IMPLANT
GOWN STRL REUS W/ TWL LRG LVL3 (GOWN DISPOSABLE) ×3 IMPLANT
GOWN STRL REUS W/TWL LRG LVL3 (GOWN DISPOSABLE) ×3
HANDLE YANKAUER SUCT BULB TIP (MISCELLANEOUS) ×2 IMPLANT
IRRIGATION STRYKERFLOW (MISCELLANEOUS) IMPLANT
IRRIGATOR STRYKERFLOW (MISCELLANEOUS)
IV NS 1000ML (IV SOLUTION) ×1
IV NS 1000ML BAXH (IV SOLUTION) ×1 IMPLANT
KIT RM TURNOVER STRD PROC AR (KITS) ×2 IMPLANT
LABEL OR SOLS (LABEL) ×2 IMPLANT
LIGASURE BLUNT 5MM 37CM (INSTRUMENTS) ×2 IMPLANT
NEEDLE HYPO 25X1 1.5 SAFETY (NEEDLE) ×2 IMPLANT
NEEDLE VERESS 14GA 120MM (NEEDLE) ×2 IMPLANT
NS IRRIG 1000ML POUR BTL (IV SOLUTION) ×2 IMPLANT
PACK COLON CLEAN CLOSURE (MISCELLANEOUS) ×2 IMPLANT
PACK LAP CHOLECYSTECTOMY (MISCELLANEOUS) ×2 IMPLANT
PAD PREP 24X41 OB/GYN DISP (PERSONAL CARE ITEMS) IMPLANT
PENCIL ELECTRO HAND CTR (MISCELLANEOUS) ×4 IMPLANT
RELOAD STAPLER BLUE 60MM (STAPLE) ×2 IMPLANT
RETRACTOR WOUND ALXS 18CM MED (MISCELLANEOUS) IMPLANT
RTRCTR WOUND ALEXIS O 18CM MED (MISCELLANEOUS)
SCISSORS METZENBAUM CVD 33 (INSTRUMENTS) IMPLANT
SLEEVE ENDOPATH XCEL 5M (ENDOMECHANICALS) ×4 IMPLANT
SOL PREP PVP 2OZ (MISCELLANEOUS) ×2
SOLUTION PREP PVP 2OZ (MISCELLANEOUS) ×1 IMPLANT
SPONGE LAP 18X18 5 PK (GAUZE/BANDAGES/DRESSINGS) ×2 IMPLANT
STAPLER CIRCULAR 29MM (STAPLE) ×2 IMPLANT
STAPLER ECHELON LONG 60 440 (INSTRUMENTS) ×2 IMPLANT
STAPLER RELOAD BLUE 60MM (STAPLE) ×4
STAPLER SKIN PROX 35W (STAPLE) IMPLANT
STRIP CLOSURE SKIN 1/2X4 (GAUZE/BANDAGES/DRESSINGS) ×2 IMPLANT
SURGILUBE 2OZ TUBE FLIPTOP (MISCELLANEOUS) ×2 IMPLANT
SUT MNCRL 4-0 (SUTURE) ×3
SUT MNCRL 4-0 27XMFL (SUTURE) ×3
SUT SILK 3-0 (SUTURE) ×1
SUT SILK 3-0 SH-1 18XCR BRD (SUTURE) ×1
SUT VIC AB 2-0 CT1 27 (SUTURE) ×2
SUT VIC AB 2-0 CT1 TAPERPNT 27 (SUTURE) ×2 IMPLANT
SUTURE MNCRL 4-0 27XMF (SUTURE) ×3 IMPLANT
SUTURE SILK 3-0 SH-1 18XCR BRD (SUTURE) ×1 IMPLANT
SYR BULB IRRIG 60ML STRL (SYRINGE) ×2 IMPLANT
SYRINGE IRR TOOMEY STRL 70CC (SYRINGE) ×2 IMPLANT
TROCAR XCEL 12X100 BLDLESS (ENDOMECHANICALS) ×2 IMPLANT
TROCAR XCEL NON-BLD 5MMX100MML (ENDOMECHANICALS) ×2 IMPLANT
TUBING INSUF HEATED (TUBING) ×2 IMPLANT
TUBING INSUFFLATOR HEATED (MISCELLANEOUS) ×2 IMPLANT

## 2016-03-05 NOTE — H&P (View-Only) (Signed)
Outpatient Surgical Follow Up  02/20/2016  Sherri Avila is an 40 y.o. female.   Chief Complaint  Patient presents with  . Follow-up    Diverticulitis    HPI: 40 year old female with recent history of diverticulitis presents to clinic for follow-up after colonoscopy earlier this week. Colonoscopy showed evidence of diverticulosis throughout the entirety of her colon. Patient states that she tolerated the colonoscopy well and is otherwise done well recently. She did have another subacute bout of diverticulitis that was treated with antibiotics and outpatient setting last month. She currently denies any fevers, chills, nausea, vomiting, chest pain, shortness of breath, diarrhea, constipation.  Past Medical History  Diagnosis Date  . Diverticulitis   . Ovarian cyst   . Diverticulitis     Past Surgical History  Procedure Laterality Date  . Ovarian cyst removal    . Colonoscopy  2006    Dr. Whitney Post  . Cesarean section  2009  . Tubal ligation  2012  . Laparoscopic oopherectomy Right 2015  . Colonoscopy with propofol N/A 02/17/2016    Procedure: COLONOSCOPY WITH PROPOFOL;  Surgeon: Midge Minium, MD;  Location: [facility];  Service: Endoscopy;  Laterality: N/A;    Family History  Problem Relation Age of Onset  . Bradycardia Father     with Pacemaker  . Heart disease Maternal Grandmother   . Diabetes Maternal Grandmother     Social History:  reports that she has never smoked. She has never used smokeless tobacco. She reports that she does not drink alcohol or use illicit drugs.  Allergies: No Known Allergies  Medications reviewed.    ROS  A multipoint review of systems was completed. All pertinent positives and negatives are documented within the history of present illness and remainder are negative.  BP 129/83 mmHg  Pulse 74  Temp(Src) 98.3 F (36.8 C) (Oral)  Wt 90.266 kg (199 lb)  LMP 01/02/2016  Physical Exam Gen.: No acute distress Neck: Supple and  nontender Chest: Clear to auscultation Heart: Regular rate and rhythm Abdomen: Soft, nontender, nondistended Extremities: Moves all extremities well.    No results found for this or any previous visit (from the past 48 hour(s)). No results found.  Assessment/Plan:  1. Diverticulosis of colon without hemorrhage 40 year old female with history of recurrent diverticulitis and evidence of pandiverticulosis on recent colonoscopy. Discussed her treatment options in detail to include the option of a sigmoid colectomy. Discussed at length that this could decrease the risk of her having a recurrent bout of diverticulitis. Also discussed that recurrent diverticulitis tends to become worse and worse over time which could eventually lead to her needing emergent surgery with a likely end colostomy. Patient stated that despite all this she needed to discuss with her family prior to deciding whether or not she wants surgery. She'll call back to clinic to either schedule a follow-up or discuss surgery dates. Again described the symptoms of diverticulitis and to seek medical attention immediately should they occur. Total time for this encounter was 15 minutes.     Ricarda Frame, MD FACS General Surgeon  02/20/2016,12:17 PM

## 2016-03-05 NOTE — Brief Op Note (Signed)
03/05/2016  4:43 PM  PATIENT:  Sherri Avila  40 y.o. female  PRE-OPERATIVE DIAGNOSIS:  DIVERTICULITIS OF COLON WITHOUT HEMORRHAGE   POST-OPERATIVE DIAGNOSIS:  DIVERTICULITIS OF COLON WITHOUT HEMORRHAGE   PROCEDURE:  Procedure(s): LAPAROSCOPIC SIGMOID COLECTOMY (N/A)  SURGEON:  Surgeon(s) and Role:    * Ricarda Frame, MD - Primary    * Hulda Marin, MD - Assisting  PHYSICIAN ASSISTANT:   ASSISTANTS: none   ANESTHESIA:   general  EBL:  Total I/O In: 1500 [I.V.:1500] Out: 300 [Urine:250; Blood:50]  BLOOD ADMINISTERED:none  DRAINS: none   LOCAL MEDICATIONS USED:  MARCAINE   , XYLOCAINE  and Amount: 20 ml  SPECIMEN:  Source of Specimen:  sigmoid colon  DISPOSITION OF SPECIMEN:  PATHOLOGY  COUNTS:  YES  TOURNIQUET:  * No tourniquets in log *  DICTATION: .Dragon Dictation  PLAN OF CARE: Admit to inpatient   PATIENT DISPOSITION:  PACU - hemodynamically stable.   Delay start of Pharmacological VTE agent (>24hrs) due to surgical blood loss or risk of bleeding: no

## 2016-03-05 NOTE — Op Note (Signed)
Pre-operative Diagnosis: History of diverticulitis  Post-operative Diagnosis: Same  Surgeon: Ricarda Frame   Assistants: Dr. Hulda Marin  Anesthesia: General endotracheal anesthesia  ASA Class: 2  Surgeon: Ricarda Frame, MD FACS  Anesthesia: Gen. with endotracheal tube  Assistant: Same as above  Procedure Details  The patient was seen again in the Holding Room. The benefits, complications, treatment options, and expected outcomes were discussed with the patient. The risks of bleeding, infection, recurrence of symptoms, failure to resolve symptoms,  bowel injury, any of which could require further surgery were reviewed with the patient.   The patient was taken to Operating Room, identified as Sherri Avila and the procedure verified.  A Time Out was held and the above information confirmed.  Prior to the induction of general anesthesia, antibiotic prophylaxis was administered. VTE prophylaxis was in place. General endotracheal anesthesia was then administered and tolerated well. After the induction, the abdomen was prepped with Chloraprep and draped in the sterile fashion. The patient was positioned in the low lithotomy position.  The procedure began with the right upper quadrant Veress needle approach. 2 Finger breaths below the costal margin in the midclavicular line and the skin was localized with a 50-50 mixture of Marcaine and lidocaine and incised with a 15 blade scalpel. Using a Veress needle a pneumoperitoneum was established at 15 mmHg. We then obtained access to the peritoneal cavity with a 5 mm Optiview trocar. There was no evidence to damage to deep or surrounding structures from either the needle or the Optiview trocar. We then able to place an infraumbilical 5 mm trocar, a right lower quadrant 12 mm trocar, and a low midline 5 mm trocar all under direct visualization after using the aforementioned local anesthetic.  Patient was then placed head down and rotated to  her right and was noticed to have a fair amount of adhesions from her previous diverticulitis. The adhesions were flimsy and able to be bluntly taken down with ease. This allowed free mobilization of the sigmoid colon from all his other attachments in the area of disease was able to be clearly identified. The diseased sigmoid colon was in a very redundant part of her colon and was elevated to the abdominal wall and using a LigaSure device the mesentery was taken along the colon down to the peritoneal reflection with ease. Any evidence of bleeding was made hemostatic with the LigaSure device. The dissection stayed right along the colon and no point was the ureter visualized.  With the dissection taken out. Reflection and the colon circumferentially freed from all its attachments a 60 mm blue load echelon powered stapler was used to ligate the colon. At this point the proximal area that was free from disease was noted to easily reach the pelvis and we proceeded to convert our low midline 5 mm trocar into a Pfannenstiel incision both sharply and with electrocautery. The fascia was opened up vertically there are Pfannenstiel incision and a wound protector was placed in the abdomen. The dissected out sigmoid colon was able to deliver through wound protector and the proximal end was ligated with an additional fire of the echelon stapler.  We then opened up our proximal staple line and using EEA sizers found that it was able to accept a 29 [place] EEA. A pursestring suture of 2-0 Prolene was placed around the open colon and the anvil from the 29 EEA stapler was placed into it with ease. We sequentially freed up the mucosa from any fatty attachments and  returned the anvil to the perineal cavity. The wound protector was then twisted and held in place with a large Kelly forceps which allowed our pneumoperitoneum reestablished. The rectum was then digitalized and found to accept a 25 and 29 [place] EEA sizer was ease. The 29  stapler was then placed into the rectum and the rectal stump was noticed to have a fair amount of excess tissue over the top of which was removed with the LigaSure device. The post was then extended on the stapler visualized going into our perineal cavity free from any other structures and able married with the anvil. These were close together without any excess tissue between them and without any undue tension or rotation of the proximal limb. The EEA stapler was fired and removed without difficulty revealing 2 intact doughnuts on the back table.  While performing the leak test under water a left ovarian cyst was bluntly entered into with the suction irrigator and the cyst contents were evacuated. The pelvis was filled with water and the proximal sigmoid colon was blocked with a bowel grasper for the leak test. The rectum was instilled with air using a Toomey syringe which showed no evidence of air leak from our new anastomosis. We then removed all the irrigation fluid. The entire abdomen was revisualized there is no evidence of bowel injury or active bleeding in any quadrant. We then removed all of our operative trochars and a wound protector to release our pneumoperitoneum.  At this point all the operative team members broke scrub and a new operative nurse came and redraped for a clean closure. The pain is still incision was closed at the fascia with a running #1 PDS suture. The dermal tissues reapproximated with a simple interrupted 2-0 Vicryl. Our laparoscopic incisions were closed with a subcuticular 4-0 Monocryl. Our plan is still incision was closed with surgical staples. Mastisol and Steri-Strips were placed at the laparoscopic incisions. In all of our incision sites were covered with small honeycomb dressings.  The patient was returned to the supine position, awoken from general endotracheal anesthesia and transferred to the PACU in good condition. There were no immediate, complications and the  patient tolerated procedure well. All counts were correct at the end of the procedure.  Findings: History of prior diverticulitis   Estimated Blood Loss: 50 mL         Drains: None         Specimens: Sigmoid colon          Complications: None                  Condition: Good   Ricarda Frame, MD, FACS

## 2016-03-05 NOTE — Anesthesia Preprocedure Evaluation (Addendum)
Anesthesia Evaluation  Patient identified by MRN, date of birth, ID band Patient awake    Reviewed: Allergy & Precautions, NPO status , Patient's Chart, lab work & pertinent test results  Airway Mallampati: III  TM Distance: <3 FB Neck ROM: Full    Dental   Pulmonary neg pulmonary ROS,    Pulmonary exam normal        Cardiovascular negative cardio ROS Normal cardiovascular exam     Neuro/Psych negative neurological ROS  negative psych ROS   GI/Hepatic Neg liver ROS, Diverticulitis   Endo/Other  negative endocrine ROS  Renal/GU negative Renal ROS  Female GU complaint Hx of ovarian cyst    Musculoskeletal negative musculoskeletal ROS (+)   Abdominal   Peds negative pediatric ROS (+)  Hematology negative hematology ROS (+)   Anesthesia Other Findings   Reproductive/Obstetrics                            Anesthesia Physical  Anesthesia Plan  ASA: II  Anesthesia Plan: General   Post-op Pain Management:    Induction: Intravenous and Rapid sequence  Airway Management Planned: Oral ETT  Additional Equipment:   Intra-op Plan:   Post-operative Plan: Extubation in OR  Informed Consent: I have reviewed the patients History and Physical, chart, labs and discussed the procedure including the risks, benefits and alternatives for the proposed anesthesia with the patient or authorized representative who has indicated his/her understanding and acceptance.   Dental advisory given  Plan Discussed with: CRNA and Surgeon  Anesthesia Plan Comments:         Anesthesia Quick Evaluation

## 2016-03-05 NOTE — Transfer of Care (Signed)
Immediate Anesthesia Transfer of Care Note  Patient: Sherri Avila  Procedure(s) Performed: Procedure(s): LAPAROSCOPIC SIGMOID COLECTOMY (N/A)  Patient Location: PACU  Anesthesia Type:General  Level of Consciousness: awake, alert , oriented and patient cooperative  Airway & Oxygen Therapy: Patient Spontanous Breathing and Patient connected to nasal cannula oxygen  Post-op Assessment: Report given to RN and Post -op Vital signs reviewed and stable  Post vital signs: Reviewed and stable  Last Vitals:  Vitals:   03/05/16 1200  BP: 130/78  Pulse: 85  Resp: 18  Temp: 36.8 C    Last Pain:  Vitals:   03/05/16 1200  TempSrc: Oral         Complications: No apparent anesthesia complications

## 2016-03-05 NOTE — Interval H&P Note (Signed)
History and Physical Interval Note:  03/05/2016 1:47 PM  Sherri Avila  has presented today for surgery, with the diagnosis of DIVERTICULITIS OF COLON W/O HEMORRHAGE  The various methods of treatment have been discussed with the patient and family. After consideration of risks, benefits and other options for treatment, the patient has consented to  Procedure(s): LAPAROSCOPIC SIGMOID COLECTOMY (N/A) as a surgical intervention .  The patient's history has been reviewed, patient examined, no change in status, stable for surgery.  I have reviewed the patient's chart and labs.  Questions were answered to the patient's satisfaction.     Ricarda Frame

## 2016-03-05 NOTE — Anesthesia Procedure Notes (Signed)
Procedure Name: Intubation Date/Time: 03/05/2016 2:25 PM Performed by: Shirlee Limerick, Zeriyah Wain Pre-anesthesia Checklist: Patient identified, Emergency Drugs available, Suction available and Patient being monitored Patient Re-evaluated:Patient Re-evaluated prior to inductionOxygen Delivery Method: Circle system utilized Preoxygenation: Pre-oxygenation with 100% oxygen Intubation Type: IV induction Laryngoscope Size: Mac and 3 Grade View: Grade I Tube type: Oral Tube size: 7.0 mm Number of attempts: 1 Placement Confirmation: ETT inserted through vocal cords under direct vision,  positive ETCO2 and breath sounds checked- equal and bilateral Secured at: 21 cm Tube secured with: Tape Dental Injury: Teeth and Oropharynx as per pre-operative assessment

## 2016-03-06 ENCOUNTER — Encounter: Payer: Self-pay | Admitting: General Surgery

## 2016-03-06 NOTE — Anesthesia Postprocedure Evaluation (Signed)
Anesthesia Post Note  Patient: Sherri Avila  Procedure(s) Performed: Procedure(s) (LRB): LAPAROSCOPIC SIGMOID COLECTOMY (N/A)  Patient location during evaluation: PACU Anesthesia Type: General Level of consciousness: awake and alert and oriented Pain management: pain level controlled Vital Signs Assessment: post-procedure vital signs reviewed and stable Respiratory status: spontaneous breathing Cardiovascular status: blood pressure returned to baseline Anesthetic complications: no    Last Vitals:  Vitals:   03/06/16 0533 03/06/16 1100  BP: (!) 111/54 115/71  Pulse: 73 74  Resp: 20 19  Temp: 36.6 C 36.7 C    Last Pain:  Vitals:   03/06/16 1504  TempSrc:   PainSc: 8                  Miranda Frese

## 2016-03-06 NOTE — Progress Notes (Signed)
1 Day Post-Op   Subjective:  Patient reports having no pain night. Having the anticipated abdominal pain. Tolerating clear liquids without nausea or vomiting. Denies any flatus or bowel function.  Vital signs in last 24 hours: Temp:  [97.2 F (36.2 C)-98.4 F (36.9 C)] 97.8 F (36.6 C) (08/04 0533) Pulse Rate:  [66-86] 73 (08/04 0533) Resp:  [12-20] 20 (08/04 0533) BP: (108-130)/(54-87) 111/54 (08/04 0533) SpO2:  [96 %-100 %] 98 % (08/04 0533) Weight:  [90.3 kg (199 lb)] 90.3 kg (199 lb) (08/03 1200)    Intake/Output from previous day: 08/03 0701 - 08/04 0700 In: 3388 [I.V.:3388] Out: 1700 [Urine:1650; Blood:50]  GI: Abdomen is soft, appropriately tender to palpation at incision sites, nondistended. Dressings are in place without any evidence of erythema or infection.  Lab Results:  CBC  Recent Labs  03/05/16 2008  WBC 15.8*  HGB 12.6  HCT 37.1  PLT 250   CMP     Component Value Date/Time   NA 135 02/28/2016 1351   NA 138 01/01/2014 1344   K 4.0 02/28/2016 1351   K 3.8 01/01/2014 1344   CL 102 02/28/2016 1351   CL 106 01/01/2014 1344   CO2 26 02/28/2016 1351   CO2 24 01/01/2014 1344   GLUCOSE 78 02/28/2016 1351   GLUCOSE 73 01/01/2014 1344   BUN 14 02/28/2016 1351   BUN 11 01/01/2014 1344   CREATININE 0.64 03/05/2016 2008   CREATININE 0.59 (L) 01/01/2014 1344   CALCIUM 9.4 02/28/2016 1351   CALCIUM 8.9 01/01/2014 1344   PROT 8.1 02/28/2016 1351   ALBUMIN 4.1 02/28/2016 1351   AST 21 02/28/2016 1351   ALT 20 02/28/2016 1351   ALKPHOS 54 02/28/2016 1351   BILITOT 0.6 02/28/2016 1351   GFRNONAA >60 03/05/2016 2008   GFRNONAA >60 01/01/2014 1344   GFRAA >60 03/05/2016 2008   GFRAA >60 01/01/2014 1344   PT/INR No results for input(s): LABPROT, INR in the last 72 hours.  Studies/Results: No results found.  Assessment/Plan: 40 year old female status post laparoscopic sigmoid colectomy. Doing well. Encourage ambulation and incentive spirometer usage.  Discussed that we would continue her clear liquid diet and IV fluids until she had return of bowel function.   Ricarda Frame, MD FACS General Surgeon  03/06/2016

## 2016-03-07 MED ORDER — HYDROCODONE-ACETAMINOPHEN 5-325 MG PO TABS: 1.0000 | ORAL_TABLET | ORAL | 0 refills | Status: DC | PRN

## 2016-03-07 NOTE — Discharge Instructions (Signed)
Laparoscopic Colectomy, Care After °Refer to this sheet in the next few weeks. These instructions provide you with information on caring for yourself after your procedure. Your health care provider may also give you more specific instructions. Your treatment has been planned according to current medical practices, but problems sometimes occur. Call your health care provider if you have any problems or questions after your procedure. °WHAT TO EXPECT AFTER THE PROCEDURE °After your procedure, it is typical to have the following: °· Pain in your abdomen, especially at the incision sites. You will be given pain medicine to control the pain. °· Tiredness. This is a normal part of the recovery process. Your energy level will return to normal over the next several weeks. °· Constipation. You may be given stool softeners to prevent this. °HOME CARE INSTRUCTIONS  °· Only take over-the-counter or prescription medicines as directed by your health care provider. °· Ask your health care provider whether you may take a shower when you go home. °· Remove or change any bandages (dressings) as directed. °· You may resume a normal diet and activities as directed. Eat plenty of fruits and vegetables to help prevent constipation. °· Drink enough fluids to keep your urine clear or pale yellow. This also helps prevent constipation. °· Take rest breaks during the day as needed. °· Avoid lifting anything heavier than 25 pounds (11.3 kg) or driving for 4 weeks or until your health care provider says it is okay. °· Follow up with your health care provider as directed. Ask your health care provider when to make an appointment to get your stitches or staples removed. °SEEK MEDICAL CARE IF:  °· You have increased bleeding from the incision areas. °· You have redness, swelling, or increasing pain in the wounds. °· You see pus coming from a wound. °· You have a fever. °· You notice a foul smell coming from the wound or dressing. °· Your wound is  breaking open (edges not staying together) after sutures or staples have been removed. °SEEK IMMEDIATE MEDICAL CARE IF: °· You develop a rash. °· You have chest pain or difficulty breathing. °· You have pain or swelling in your legs. °· You have lightheadedness or feel faint. °· Your abdomen becomes larger (distended). °· You have nausea or vomiting. °· You have blood in your stools. °  °This information is not intended to replace advice given to you by your health care provider. Make sure you discuss any questions you have with your health care provider. °  °Document Released: 02/06/2005 Document Revised: 05/10/2013 Document Reviewed: 03/01/2013 °Elsevier Interactive Patient Education ©2016 [employer] ° °

## 2016-03-07 NOTE — Discharge Summary (Signed)
Patient ID: Arya Spare MRN: [id number] DOB/AGE: 1975-11-13 40 y.o.  Admit date: 03/05/2016 Discharge date: 03/07/2016  Discharge Diagnoses:  History of Diverticulitis   Procedures Performed: Laparoscopic Sigmoid Colectomy  Discharged Condition: good  Hospital Course: Taken to the OR for a scheduled laparoscopic sigmoid colectomy. Tolerated the procedure well. At the time of discharge she was tolerating a regular diet, having bowel function and had pain controlled with only oral medications.  Discharge Orders: HOME  Disposition: 01-Home or Self Care  Discharge Medications:   Medication List    TAKE these medications   acetaminophen 325 MG tablet Commonly known as:  TYLENOL Take 650 mg by mouth every 6 (six) hours as needed.   HYDROcodone-acetaminophen 5-325 MG tablet Commonly known as:  NORCO/VICODIN Take 1-2 tablets by mouth every 4 (four) hours as needed for moderate pain.        Follwup: Follow-up Information    [facility]. Schedule an appointment as soon as possible for a visit in 6 day(s).   Specialty:  General Surgery Why:  staple removal Contact information: [address] [place] [address] [phone]          Signed: Ricarda Frame 03/07/2016, 2:37 PM

## 2016-03-07 NOTE — Progress Notes (Signed)
2 Days Post-Op   Subjective:  Patient reports doing well. Having some incisional abdominal pain but it is improving. She has passed flatus and some blood per rectum overnight. Denies any nausea or vomiting and has been tolerating clear liquids.  Vital signs in last 24 hours: Temp:  [97.7 F (36.5 C)-98.1 F (36.7 C)] 98.1 F (36.7 C) (08/05 0450) Pulse Rate:  [65-74] 65 (08/05 0450) Resp:  [19-20] 20 (08/05 0450) BP: (91-115)/(52-71) 91/52 (08/05 0450) SpO2:  [95 %-100 %] 95 % (08/05 0450)    Intake/Output from previous day: 08/04 0701 - 08/05 0700 In: 1150 [P.O.:1150] Out: 3600 [Urine:3600]  GI: Abdomen soft, appropriately tender to palpation at her incisions, nondistended. Dressings removed today with skin well approximated without evidence of erythema or purulent drainage.  Lab Results:  CBC  Recent Labs  03/05/16 2008  WBC 15.8*  HGB 12.6  HCT 37.1  PLT 250   CMP     Component Value Date/Time   NA 135 02/28/2016 1351   NA 138 01/01/2014 1344   K 4.0 02/28/2016 1351   K 3.8 01/01/2014 1344   CL 102 02/28/2016 1351   CL 106 01/01/2014 1344   CO2 26 02/28/2016 1351   CO2 24 01/01/2014 1344   GLUCOSE 78 02/28/2016 1351   GLUCOSE 73 01/01/2014 1344   BUN 14 02/28/2016 1351   BUN 11 01/01/2014 1344   CREATININE 0.64 03/05/2016 2008   CREATININE 0.59 (L) 01/01/2014 1344   CALCIUM 9.4 02/28/2016 1351   CALCIUM 8.9 01/01/2014 1344   PROT 8.1 02/28/2016 1351   ALBUMIN 4.1 02/28/2016 1351   AST 21 02/28/2016 1351   ALT 20 02/28/2016 1351   ALKPHOS 54 02/28/2016 1351   BILITOT 0.6 02/28/2016 1351   GFRNONAA >60 03/05/2016 2008   GFRNONAA >60 01/01/2014 1344   GFRAA >60 03/05/2016 2008   GFRAA >60 01/01/2014 1344   PT/INR No results for input(s): LABPROT, INR in the last 72 hours.  Studies/Results: No results found.  Assessment/Plan: 40 year old female status post laparoscopic sigmoid colectomy. Doing well. Showing evidence of return of bowel function. We  will advance her diet this morning. Encourage ambulation and incentive spirometer usage. Possible discharge home later today versus tomorrow morning depending on patient's clinical condition.   Ricarda Frame, MD FACS General Surgeon  03/07/2016

## 2016-03-09 LAB — SURGICAL PATHOLOGY

## 2016-03-13 ENCOUNTER — Ambulatory Visit (INDEPENDENT_AMBULATORY_CARE_PROVIDER_SITE_OTHER): Payer: 59 | Admitting: General Surgery

## 2016-03-13 ENCOUNTER — Encounter: Payer: Self-pay | Admitting: General Surgery

## 2016-03-13 VITALS — BP 118/75 | HR 71 | Temp 98.4°F | Wt 198.0 lb

## 2016-03-13 DIAGNOSIS — Z4889 Encounter for other specified surgical aftercare: Secondary | ICD-10-CM

## 2016-03-13 NOTE — Patient Instructions (Signed)

## 2016-03-13 NOTE — Progress Notes (Signed)
Outpatient Surgical Follow Up  03/13/2016  Sherri Avila is an 40 y.o. female.   Chief Complaint  Patient presents with  . Routine Post Op    Laparoscopic Sigmoid Colectomy Dr. Tonita CongWoodham 03/05/2016    HPI: 40 year old female returns to clinic 1 week status post laparoscopic sigmoid colectomy. Patient reports doing very well. She denies any pain. She is eating well and having normal bowel function. She denies any fevers, chills, nausea, vomiting, chest pain, shortness of breath. She's been very happy with her surgical results and is here today for staple removal.  Past Medical History:  Diagnosis Date  . Diverticulitis   . Diverticulitis   . Ovarian cyst     Past Surgical History:  Procedure Laterality Date  . CESAREAN SECTION  2009  . COLONOSCOPY  2006   Dr. Whitney PostMahsood  . COLONOSCOPY WITH PROPOFOL N/A 02/17/2016   Procedure: COLONOSCOPY WITH PROPOFOL;  Surgeon: Midge Miniumarren Wohl, MD;  Location: [facility];  Service: Endoscopy;  Laterality: N/A;  . LAPAROSCOPIC OOPHERECTOMY Right 2015  . LAPAROSCOPIC SIGMOID COLECTOMY N/A 03/05/2016   Procedure: LAPAROSCOPIC SIGMOID COLECTOMY;  Surgeon: Ricarda Frameharles Bryann Gentz, MD;  Location: [facility];  Service: General;  Laterality: N/A;  . OVARIAN CYST REMOVAL    . TUBAL LIGATION  2012    Family History  Problem Relation Age of Onset  . Bradycardia Father     with Pacemaker  . Heart disease Maternal Grandmother   . Diabetes Maternal Grandmother     Social History:  reports that she has never smoked. She has never used smokeless tobacco. She reports that she does not drink alcohol or use drugs.  Allergies: No Known Allergies  Medications reviewed.    ROS A multipoint review of systems was completed, all pertinent positives and negatives are documented within the history of present illness and remainder are negative.   BP 118/75 (BP Location: Left Arm, Patient Position: Sitting, Cuff Size: Large)   Pulse 71   Temp 98.4 F (36.9 C) (Oral)    Wt 89.8 kg (198 lb)   LMP 02/14/2016 (Approximate)   BMI 36.21 kg/m   Physical Exam Gen.: No acute distress Chest: Clear to sedation Heart: Regular rhythm Abdomen: Soft, appropriately tender to palpation at her incision sites, nondistended. 3 laparoscopic and Pfannenstiel incision all well approximated without evidence of erythema or drainage.    No results found for this or any previous visit (from the past 48 hour(s)). No results found.  Assessment/Plan:  1. Aftercare following surgery 40 year old female one week status post laparoscopic sigmoid colectomy. Doing well. Staples removed today and replaced with Steri-Strips. Provided with counseling for signs and symptoms of infection and return to clinic immediately should they occur. Otherwise she'll follow-up in clinic in 3 weeks for additional wound check.     Ricarda Frameharles Jarika Robben, MD FACS General Surgeon  03/13/2016,3:58 PM

## 2016-04-15 ENCOUNTER — Ambulatory Visit (INDEPENDENT_AMBULATORY_CARE_PROVIDER_SITE_OTHER): Payer: 59 | Admitting: General Surgery

## 2016-04-15 ENCOUNTER — Encounter: Payer: Self-pay | Admitting: General Surgery

## 2016-04-15 VITALS — BP 159/82 | HR 68 | Temp 98.4°F | Ht 62.0 in | Wt 204.8 lb

## 2016-04-15 DIAGNOSIS — Z4889 Encounter for other specified surgical aftercare: Secondary | ICD-10-CM

## 2016-04-15 NOTE — Progress Notes (Signed)
Outpatient Surgical Follow Up  04/15/2016  Sherri SalmShirley Avila is an 40 y.o. female.   Chief Complaint  Patient presents with  . Routine Post Op    Laparoscopic Sigmoid Colectomy (03/05/16)- Dr. Tonita CongWoodham    HPI: 40 year old female returns to clinic for follow-up 6 weeks status post laparoscopic sigmoid colon resection. Patient reports doing very well. She denies any fevers, chills, nausea, vomiting, diarrhea, constipation. She's not having any pain and has returned to her normal activities. She's been very happy with her surgical experience.  Past Medical History:  Diagnosis Date  . Diverticulitis   . Diverticulitis   . Ovarian cyst     Past Surgical History:  Procedure Laterality Date  . CESAREAN SECTION  2009  . COLONOSCOPY  2006   Dr. Whitney PostMahsood  . COLONOSCOPY WITH PROPOFOL N/A 02/17/2016   Procedure: COLONOSCOPY WITH PROPOFOL;  Surgeon: Midge Miniumarren Wohl, MD;  [facility];  Service: Endoscopy;  Laterality: N/A;  . LAPAROSCOPIC OOPHERECTOMY Right 2015  . LAPAROSCOPIC SIGMOID COLECTOMY N/A 03/05/2016   Procedure: LAPAROSCOPIC SIGMOID COLECTOMY;  Surgeon: Ricarda Frameharles Arantza Darrington, MD;  Location: [facility];  Service: General;  Laterality: N/A;  . OVARIAN CYST REMOVAL    . TUBAL LIGATION  2012    Family History  Problem Relation Age of Onset  . Bradycardia Father     with Pacemaker  . Heart disease Maternal Grandmother   . Diabetes Maternal Grandmother     Social History:  reports that she has never smoked. She has never used smokeless tobacco. She reports that she does not drink alcohol or use drugs.  Allergies: No Known Allergies  Medications reviewed.    ROS A multipoint review of systems has been completed, all pertinent positives and negatives are documented within the history of present illness and remainder are negative.   BP (!) 159/82   Pulse 68   Temp 98.4 F (36.9 C) (Oral)   Ht 5\' 2"  (1.575 m)   Wt 92.9 kg (204 lb 12.8 oz)   BMI 37.46 kg/m   Physical  Exam Gen.: No acute distress Chest: Clear to auscultation Heart: Regular rhythm Abdomen: Soft, nontender, nondistended. Well-healed laparoscopic incision sites without evidence of erythema or drainage.    No results found for this or any previous visit (from the past 48 hour(s)). No results found.  Assessment/Plan:  1. Aftercare following surgery 40 year old female 6 weeks status post laparoscopic sigmoid colon resection. Doing well. Discussed signs and symptoms of infection and means to limit the occurrence of constipation. All questions answered to her satisfaction and she'll follow up on an as-needed basis.     Ricarda Frameharles Delmont Prosch, MD FACS General Surgeon  04/15/2016,9:53 AM

## 2016-04-15 NOTE — Patient Instructions (Signed)
Please call our office with any questions or concerns. 

## 2016-12-21 IMAGING — CT CT ABD-PELV W/ CM
2 of 5 series · 16 of 46 positions shown, 18 images · IV contrast (iopamidol)
Comparison: 10/29/2015

CLINICAL DATA: Abdominal pain for 2 days, elevated temperature to
2002.4, right upper quadrant pain

EXAM:
CT ABDOMEN AND PELVIS WITH CONTRAST
TECHNIQUE: Multidetector CT imaging of the abdomen and pelvis was performed
using the standard protocol following bolus administration of
intravenous contrast.
CONTRAST:  100mL D91EOU-2CC IOPAMIDOL (D91EOU-2CC) INJECTION 61%

[Series 2: routine abd pel with · axial · 0.82mm/px · z∈[-390,+36]mm · 13 of 95 slices shown, 15 images]
[im 5/95  soft-tissue]
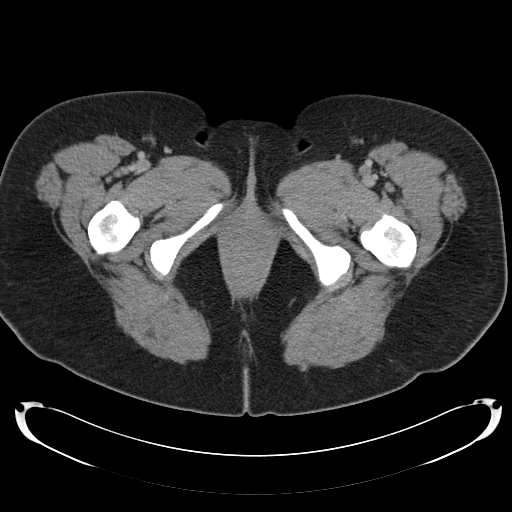
[im 5/95  bone]
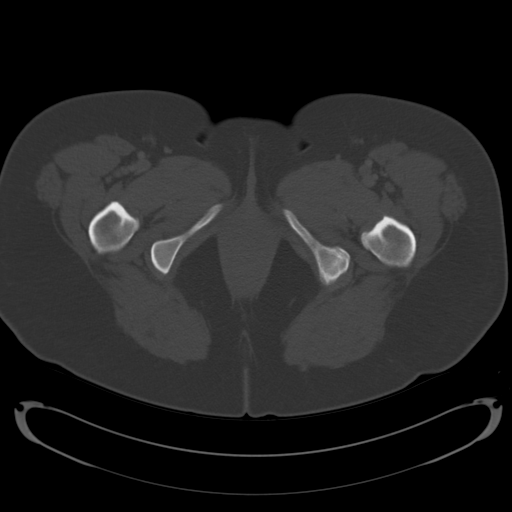
[im 15/95  soft-tissue]
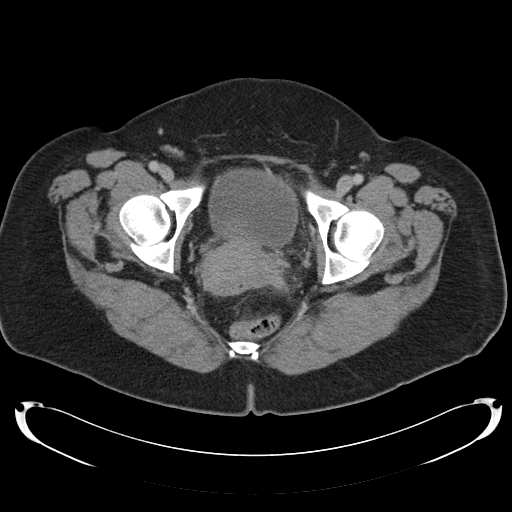
[im 20/95  soft-tissue]
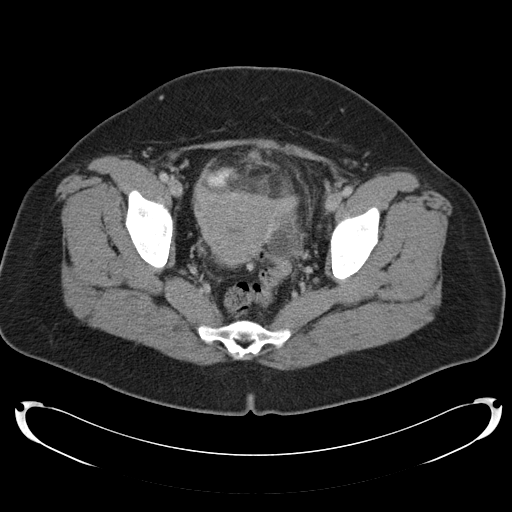
[im 25/95  soft-tissue]
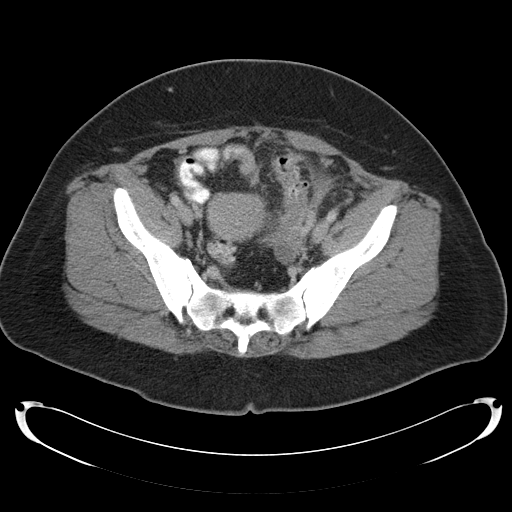
[im 35/95  soft-tissue]
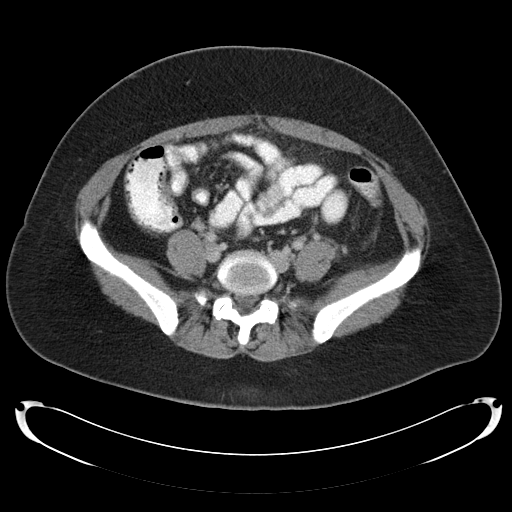
[im 40/95  soft-tissue]
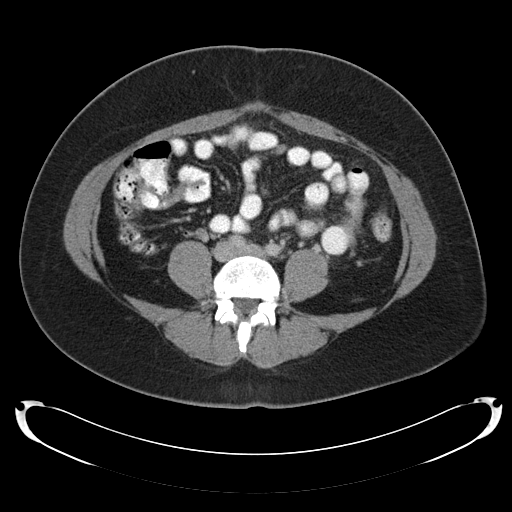
[im 50/95  soft-tissue]
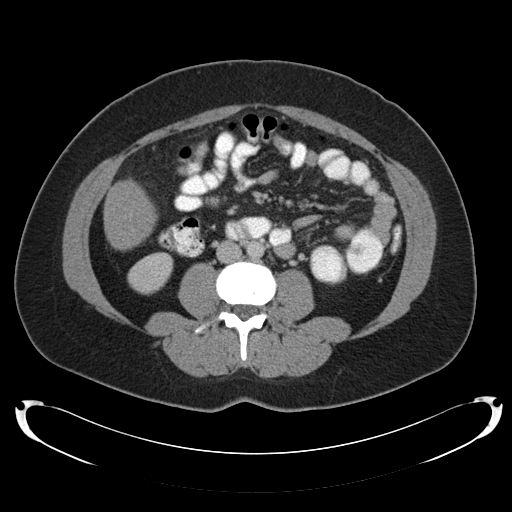
[im 55/95  soft-tissue]
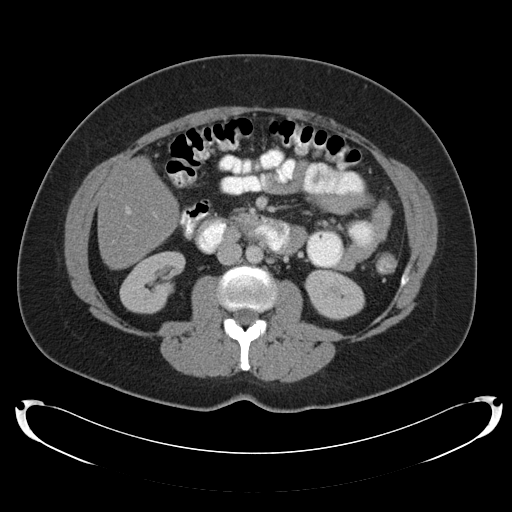
[im 60/95  soft-tissue]
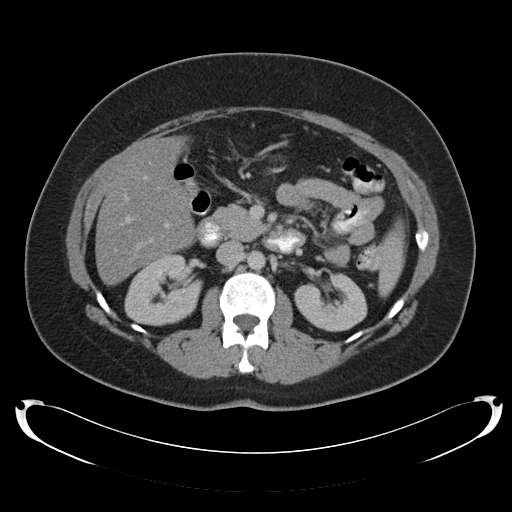
[im 60/95  bone]
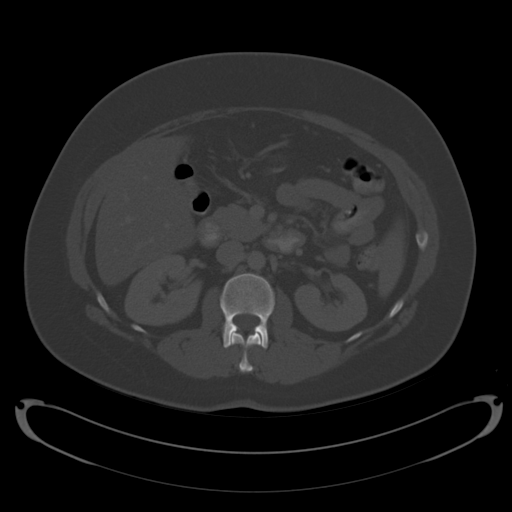
[im 70/95  soft-tissue]
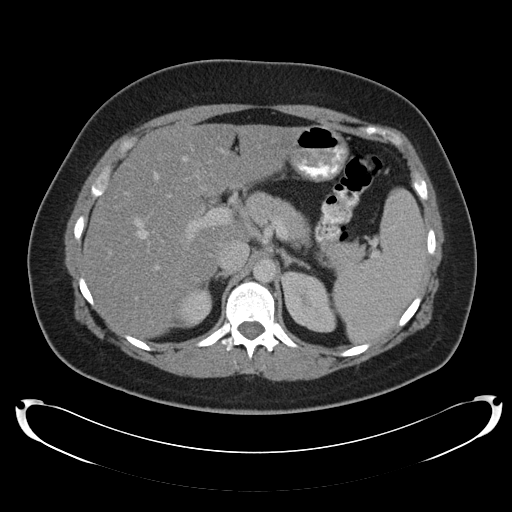
[im 75/95  soft-tissue]
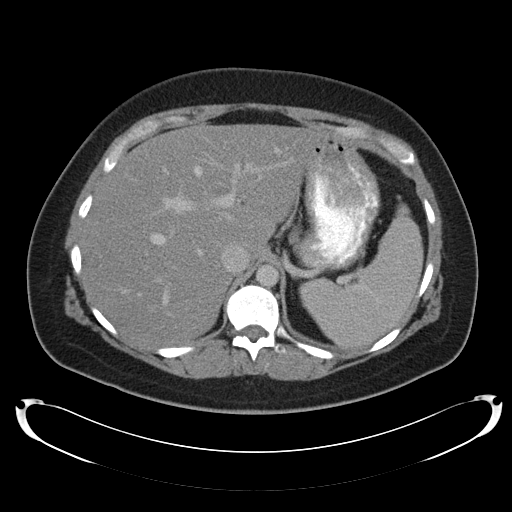
[im 80/95  soft-tissue]
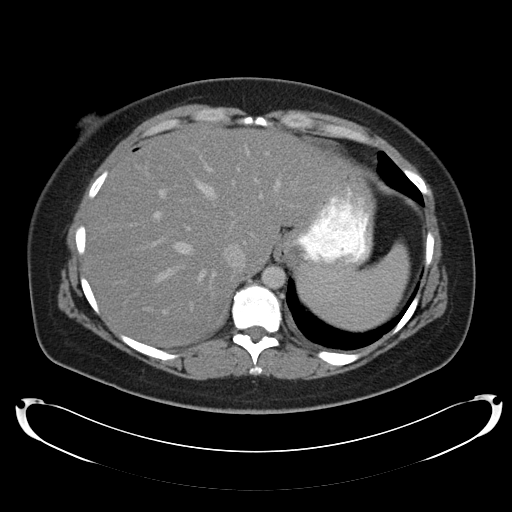
[im 90/95  soft-tissue]
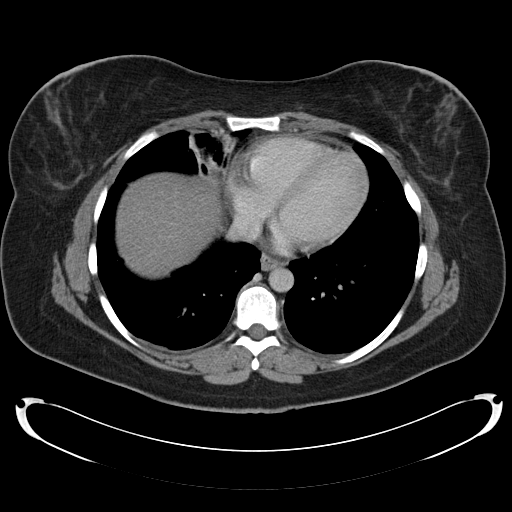

[Series 5: cor routine abd pel with · coronal · 0.82mm/px · 3 of 154 slices shown]
[im 52/154  soft-tissue]
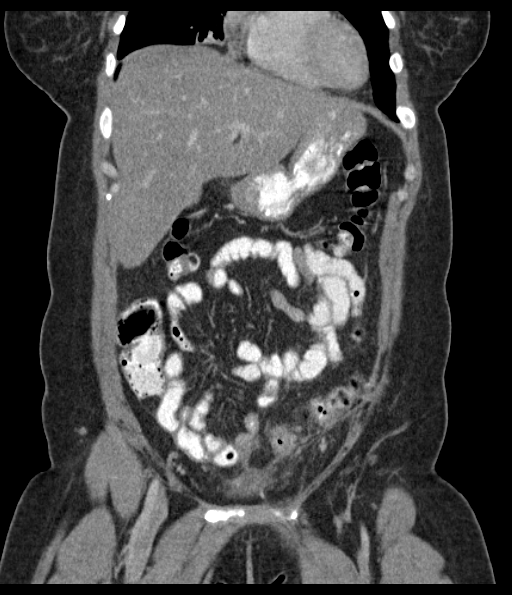
[im 69/154  soft-tissue]
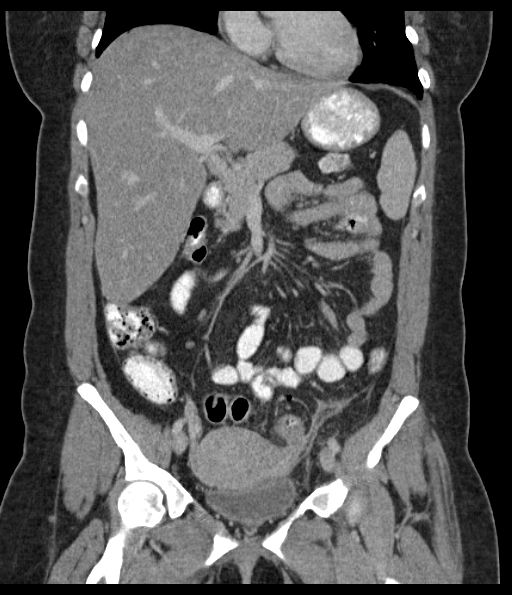
[im 86/154  soft-tissue]
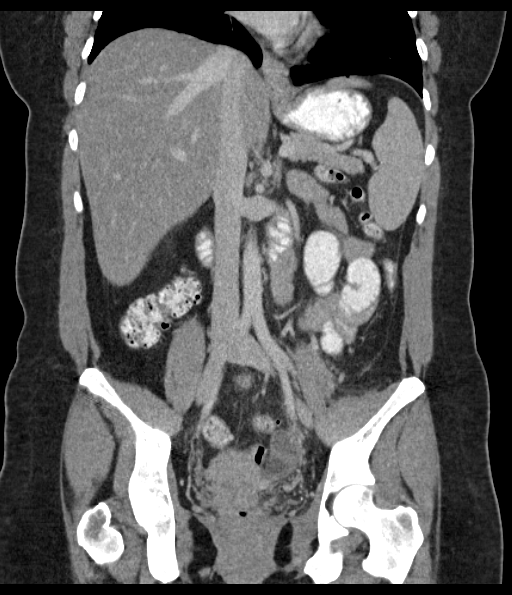

[16 of 46 positions shown; findings below may reference images not displayed]

FINDINGS: Lower chest: Consolidation inferior medial right middle lobe
consistent with atelectasis

Hepatobiliary: Mild diffuse hepatic steatosis. Gallbladder normal.
Mild hepatomegaly.

Pancreas: Pancreas is normal

Spleen: Spleen is normal

Adrenals/Urinary Tract: Adrenal glands are normal. Right kidney is
normal. 5 mm low-attenuation lesion midpole left kidney stable. No
hydronephrosis.

Stomach/Bowel: Stomach is normal. Appendix is normal. Small bowel is
normal. There is diverticulosis of the sigmoid colon. There is wall
thickening of the proximal sigmoid colon with moderate surrounding
inflammatory change. There is a very small volume of free fluid
lateral to this portion of colon. There is a 2 cm oval fluid
collection in the anterior left pelvis at this level which shows
partial rim enhancement. A tiny focus of air seen within this
collection.

Vascular/Lymphatic: No significant vascular abnormalities.
Nonpathologic retroperitoneal and mesenteric lymph nodes present.

Reproductive: 2 cm left ovarian cyst stable

Other: Small volume pneumoperitoneum appreciated over the dome of
the liver.

Musculoskeletal: No acute findings
IMPRESSION: Findings consistent with acute diverticulitis associated with small
pericolonic abscess and small volume pneumoperitoneum indicating
perforation. Critical Value/emergent results were called by
telephone at the time of interpretation on 12/26/2015 at [DATE] to
[place], the triage nurse at the referring physician's office, who
verbally acknowledged these results. These results will be called to
the ordering clinician or representative by the [HOSPITAL]
at the imaging location.

## 2018-01-28 ENCOUNTER — Other Ambulatory Visit
Admission: RE | Admit: 2018-01-28 | Discharge: 2018-01-28 | Disposition: A | Discharge status: Home / Self Care | Payer: 59 | Source: Ambulatory Visit | Attending: Family Medicine | Admitting: Family Medicine

## 2018-01-28 ENCOUNTER — Encounter: Payer: Self-pay | Admitting: Family Medicine

## 2018-01-28 ENCOUNTER — Ambulatory Visit: Payer: Self-pay | Admitting: Family Medicine

## 2018-01-28 VITALS — BP 130/90 | HR 84 | Temp 98.7°F | Ht 61.0 in | Wt 143.0 lb

## 2018-01-28 DIAGNOSIS — Z0000 Encounter for general adult medical examination without abnormal findings: Secondary | ICD-10-CM | POA: Insufficient documentation

## 2018-01-28 LAB — POCT URINALYSIS DIPSTICK
BILIRUBIN UA: NEGATIVE
Glucose, UA: NEGATIVE
Ketones, UA: NEGATIVE
Leukocytes, UA: NEGATIVE
Nitrite, UA: NEGATIVE
PH UA: 6 (ref 5.0–8.0)
Protein, UA: NEGATIVE
UROBILINOGEN UA: 0.2 U/dL

## 2018-01-28 LAB — COMPREHENSIVE METABOLIC PANEL
ALT: 12 U/L (ref 0–44)
AST: 17 U/L (ref 15–41)
Albumin: 4.5 g/dL (ref 3.5–5.0)
Alkaline Phosphatase: 42 U/L (ref 38–126)
Anion gap: 7 (ref 5–15)
BILIRUBIN TOTAL: 0.5 mg/dL (ref 0.3–1.2)
BUN: 16 mg/dL (ref 6–20)
CHLORIDE: 107 mmol/L (ref 98–111)
CO2: 25 mmol/L (ref 22–32)
CREATININE: 0.66 mg/dL (ref 0.44–1.00)
Calcium: 9.3 mg/dL (ref 8.9–10.3)
GFR calc Af Amer: >60 mL/min (ref >60)
Glucose, Bld: 89 mg/dL (ref 70–99)
POTASSIUM: 3.9 mmol/L (ref 3.5–5.1)
Sodium: 139 mmol/L (ref 135–145)
Total Protein: 7.7 g/dL (ref 6.5–8.1)

## 2018-01-28 LAB — CBC WITH DIFFERENTIAL/PLATELET
BASOS ABS: 0 10*3/uL (ref 0–0.1)
Basophils Relative: 0 %
EOS ABS: 0.1 10*3/uL (ref 0–0.7)
EOS PCT: 1 %
HCT: 43.6 % (ref 35.0–47.0)
HEMOGLOBIN: 14.6 g/dL (ref 12.0–16.0)
LYMPHS ABS: 3 10*3/uL (ref 1.0–3.6)
Lymphocytes Relative: 28 %
MCH: 30.7 pg (ref 26.0–34.0)
MCHC: 33.5 g/dL (ref 32.0–36.0)
MCV: 91.7 fL (ref 80.0–100.0)
Monocytes Absolute: 0.4 10*3/uL (ref 0.2–0.9)
Monocytes Relative: 4 %
NEUTROS PCT: 67 %
Neutro Abs: 7 10*3/uL — ABNORMAL HIGH (ref 1.4–6.5)
PLATELETS: 298 10*3/uL (ref 150–440)
RBC: 4.76 MIL/uL (ref 3.80–5.20)
RDW: 13.7 % (ref 11.5–14.5)
WBC: 10.6 10*3/uL (ref 3.6–11.0)

## 2018-01-28 LAB — LIPID PANEL
Cholesterol: 183 mg/dL (ref 0–200)
HDL: 49 mg/dL (ref >40)
LDL CALC: 118 mg/dL — AB (ref 0–99)
Total CHOL/HDL Ratio: 3.7 RATIO
Triglycerides: 79 mg/dL (ref <150)
VLDL: 16 mg/dL (ref 0–40)

## 2018-01-28 NOTE — Patient Instructions (Addendum)
You will go to the [facility] draw station to have labs drawn. See attached document. You will need to have labs drawn fasting for at least 8 hours. This information will update to MyChart and you can address any concerns with your PCP.   Health Maintenance, Female Adopting a healthy lifestyle and getting preventive care can go a long way to promote health and wellness. Talk with your health care provider about what schedule of regular examinations is right for you. This is a good chance for you to check in with your provider about disease prevention and staying healthy. In between checkups, there are plenty of things you can do on your own. Experts have done a lot of research about which lifestyle changes and preventive measures are most likely to keep you healthy. Ask your health care provider for more information. Weight and diet Eat a healthy diet  Be sure to include plenty of vegetables, fruits, low-fat dairy products, and lean protein.  Do not eat a lot of foods high in solid fats, added sugars, or salt.  Get regular exercise. This is one of the most important things you can do for your health. ? Most adults should exercise for at least 150 minutes each week. The exercise should increase your heart rate and make you sweat (moderate-intensity exercise). ? Most adults should also do strengthening exercises at least twice a week. This is in addition to the moderate-intensity exercise.  Maintain a healthy weight  Body mass index (BMI) is a measurement that can be used to identify possible weight problems. It estimates body fat based on height and weight. Your health care provider can help determine your BMI and help you achieve or maintain a healthy weight.  For females 40 years of age and older: ? A BMI below 18.5 is considered underweight. ? A BMI of 18.5 to 24.9 is normal. ? A BMI of 25 to 29.9 is considered overweight. ? A BMI of 30 and above is considered obese.  Watch levels of  cholesterol and blood lipids  You should start having your blood tested for lipids and cholesterol at 43 years of age, then have this test every 5 years.  You may need to have your cholesterol levels checked more often if: ? Your lipid or cholesterol levels are high. ? You are older than 43 years of age. ? You are at high risk for heart disease.  Cancer screening Lung Cancer  Lung cancer screening is recommended for adults 21-53 years old who are at high risk for lung cancer because of a history of smoking.  A yearly low-dose CT scan of the lungs is recommended for people who: ? Currently smoke. ? Have quit within the past 15 years. ? Have at least a 30-pack-year history of smoking. A pack year is smoking an average of one pack of cigarettes a day for 1 year.  Yearly screening should continue until it has been 15 years since you quit.  Yearly screening should stop if you develop a health problem that would prevent you from having lung cancer treatment.  Breast Cancer  Practice breast self-awareness. This means understanding how your breasts normally appear and feel.  It also means doing regular breast self-exams. Let your health care provider know about any changes, no matter how small.  If you are in your 20s or 30s, you should have a clinical breast exam (CBE) by a health care provider every 1-3 years as part of a regular health exam.  If  you are 40 or older, have a CBE every year. Also consider having a breast X-ray (mammogram) every year.  If you have a family history of breast cancer, talk to your health care provider about genetic screening.  If you are at high risk for breast cancer, talk to your health care provider about having an MRI and a mammogram every year.  Breast cancer gene (BRCA) assessment is recommended for women who have family members with BRCA-related cancers. BRCA-related cancers include: ? Breast. ? Ovarian. ? Tubal. ? Peritoneal cancers.  Results  of the assessment will determine the need for genetic counseling and BRCA1 and BRCA2 testing.  Cervical Cancer Your health care provider may recommend that you be screened regularly for cancer of the pelvic organs (ovaries, uterus, and vagina). This screening involves a pelvic examination, including checking for microscopic changes to the surface of your cervix (Pap test). You may be encouraged to have this screening done every 3 years, beginning at age 48.  For women ages 57-65, health care providers may recommend pelvic exams and Pap testing every 3 years, or they may recommend the Pap and pelvic exam, combined with testing for human papilloma virus (HPV), every 5 years. Some types of HPV increase your risk of cervical cancer. Testing for HPV may also be done on women of any age with unclear Pap test results.  Other health care providers may not recommend any screening for nonpregnant women who are considered low risk for pelvic cancer and who do not have symptoms. Ask your health care provider if a screening pelvic exam is right for you.  If you have had past treatment for cervical cancer or a condition that could lead to cancer, you need Pap tests and screening for cancer for at least 20 years after your treatment. If Pap tests have been discontinued, your risk factors (such as having a new sexual partner) need to be reassessed to determine if screening should resume. Some women have medical problems that increase the chance of getting cervical cancer. In these cases, your health care provider may recommend more frequent screening and Pap tests.  Colorectal Cancer  This type of cancer can be detected and often prevented.  Routine colorectal cancer screening usually begins at 42 years of age and continues through 43 years of age.  Your health care provider may recommend screening at an earlier age if you have risk factors for colon cancer.  Your health care provider may also recommend using  home test kits to check for hidden blood in the stool.  A small camera at the end of a tube can be used to examine your colon directly (sigmoidoscopy or colonoscopy). This is done to check for the earliest forms of colorectal cancer.  Routine screening usually begins at age 39.  Direct examination of the colon should be repeated every 5-10 years through 43 years of age. However, you may need to be screened more often if early forms of precancerous polyps or small growths are found.  Skin Cancer  Check your skin from head to toe regularly.  Tell your health care provider about any new moles or changes in moles, especially if there is a change in a mole's shape or color.  Also tell your health care provider if you have a mole that is larger than the size of a pencil eraser.  Always use sunscreen. Apply sunscreen liberally and repeatedly throughout the day.  Protect yourself by wearing long sleeves, pants, a wide-brimmed hat, and sunglasses  whenever you are outside.  Heart disease, diabetes, and high blood pressure  High blood pressure causes heart disease and increases the risk of stroke. High blood pressure is more likely to develop in: ? People who have blood pressure in the high end of the normal range (130-139/85-89 mm Hg). ? People who are overweight or obese. ? People who are African American.  If you are 37-26 years of age, have your blood pressure checked every 3-5 years. If you are 54 years of age or older, have your blood pressure checked every year. You should have your blood pressure measured twice-once when you are at a hospital or clinic, and once when you are not at a hospital or clinic. Record the average of the two measurements. To check your blood pressure when you are not at a hospital or clinic, you can use: ? An automated blood pressure machine at a pharmacy. ? A home blood pressure monitor.  If you are between 66 years and 46 years old, ask your health care provider  if you should take aspirin to prevent strokes.  Have regular diabetes screenings. This involves taking a blood sample to check your fasting blood sugar level. ? If you are at a normal weight and have a low risk for diabetes, have this test once every three years after 43 years of age. ? If you are overweight and have a high risk for diabetes, consider being tested at a younger age or more often. Preventing infection Hepatitis B  If you have a higher risk for hepatitis B, you should be screened for this virus. You are considered at high risk for hepatitis B if: ? You were born in a country where hepatitis B is common. Ask your health care provider which countries are considered high risk. ? Your parents were born in a high-risk country, and you have not been immunized against hepatitis B (hepatitis B vaccine). ? You have HIV or AIDS. ? You use needles to inject street drugs. ? You live with someone who has hepatitis B. ? You have had sex with someone who has hepatitis B. ? You get hemodialysis treatment. ? You take certain medicines for conditions, including cancer, organ transplantation, and autoimmune conditions.  Hepatitis C  Blood testing is recommended for: ? Everyone born from [birth year] through 1966. ? Anyone with known risk factors for hepatitis C.  Sexually transmitted infections (STIs)  You should be screened for sexually transmitted infections (STIs) including gonorrhea and chlamydia if: ? You are sexually active and are younger than 43 years of age. ? You are older than 43 years of age and your health care provider tells you that you are at risk for this type of infection. ? Your sexual activity has changed since you were last screened and you are at an increased risk for chlamydia or gonorrhea. Ask your health care provider if you are at risk.  If you do not have HIV, but are at risk, it may be recommended that you take a prescription medicine daily to prevent HIV infection. This  is called pre-exposure prophylaxis (PrEP). You are considered at risk if: ? You are sexually active and do not regularly use condoms or know the HIV status of your partner(s). ? You take drugs by injection. ? You are sexually active with a partner who has HIV.  Talk with your health care provider about whether you are at high risk of being infected with HIV. If you choose to begin PrEP, you should  first be tested for HIV. You should then be tested every 3 months for as long as you are taking PrEP. Pregnancy  If you are premenopausal and you may become pregnant, ask your health care provider about preconception counseling.  If you may become pregnant, take 400 to 800 micrograms (mcg) of folic acid every day.  If you want to prevent pregnancy, talk to your health care provider about birth control (contraception). Osteoporosis and menopause  Osteoporosis is a disease in which the bones lose minerals and strength with aging. This can result in serious bone fractures. Your risk for osteoporosis can be identified using a bone density scan.  If you are 72 years of age or older, or if you are at risk for osteoporosis and fractures, ask your health care provider if you should be screened.  Ask your health care provider whether you should take a calcium or vitamin D supplement to lower your risk for osteoporosis.  Menopause may have certain physical symptoms and risks.  Hormone replacement therapy may reduce some of these symptoms and risks. Talk to your health care provider about whether hormone replacement therapy is right for you. Follow these instructions at home:  Schedule regular health, dental, and eye exams.  Stay current with your immunizations.  Do not use any tobacco products including cigarettes, chewing tobacco, or electronic cigarettes.  If you are pregnant, do not drink alcohol.  If you are breastfeeding, limit how much and how often you drink alcohol.  Limit alcohol intake  to no more than 1 drink per day for nonpregnant women. One drink equals 12 ounces of beer, 5 ounces of wine, or 1 ounces of hard liquor.  Do not use street drugs.  Do not share needles.  Ask your health care provider for help if you need support or information about quitting drugs.  Tell your health care provider if you often feel depressed.  Tell your health care provider if you have ever been abused or do not feel safe at home. This information is not intended to replace advice given to you by your health care provider. Make sure you discuss any questions you have with your health care provider. Document Released: 02/02/2011 Document Revised: 12/26/2015 Document Reviewed: 04/23/2015 Elsevier Interactive Patient Education  [employer].

## 2018-01-28 NOTE — Progress Notes (Signed)
Patient ID: Shadee Rathod, female    DOB: 1975-10-14, 42 y.o.   MRN: [id number]  PCP: [facility]  Chief Complaint  Patient presents with  . save-wellness exam    Subjective:  HPI  Anely Spiewak is a 42 y.o. female presents for wellness visit in order to satisfy employee sponsored health insurance benefits. She is followed by [facility] for primary care, last seen 2017. Medical history is significant for diverticulitis (with partial colon removal) and partial hysterectomy secondary to massive sized ovarian cyst. She reports routine physical activity. Efforts to eat a healthy diet. Current Body mass index is 27.02 kg/m. She works night shift which presents challenges with frequency of exercise. She considers herself healthy at present.   Family history: Brain Cancer-Daughter 9 (living in remission) No other family history significant for cardiovascular disease, lung cancer, or breast cancer.    Social History   Socioeconomic History  . Marital status: Married    Spouse name: Not on file  . Number of children: Not on file  . Years of education: Not on file  . Highest education level: Not on file  Occupational History  . Not on file  Social Needs  . Financial resource strain: Not on file  . Food insecurity:    Worry: Not on file    Inability: Not on file  . Transportation needs:    Medical: Not on file    Non-medical: Not on file  Tobacco Use  . Smoking status: Never Smoker  . Smokeless tobacco: Never Used  Substance and Sexual Activity  . Alcohol use: No    Comment: Occasional  . Drug use: No  . Sexual activity: Not on file  Lifestyle  . Physical activity:    Days per week: Not on file    Minutes per session: Not on file  . Stress: Not on file  Relationships  . Social connections:    Talks on phone: Not on file    Gets together: Not on file    Attends religious service: Not on file    Active member of club or organization: Not on file    Attends  meetings of clubs or organizations: Not on file    Relationship status: Not on file  . Intimate partner violence:    Fear of current or ex partner: Not on file    Emotionally abused: Not on file    Physically abused: Not on file    Forced sexual activity: Not on file  Other Topics Concern  . Not on file  Social History Narrative  . Not on file    Family History  Problem Relation Age of Onset  . Bradycardia Father        with Pacemaker  . Heart disease Maternal Grandmother   . Diabetes Maternal Grandmother    Review of Systems Constitutional: Negative for fever, chills, diaphoresis, activity change, appetite change and fatigue. HENT: Negative for ear pain, nosebleeds, congestion, facial swelling, rhinorrhea, neck pain, neck stiffness and ear discharge.  Eyes: Negative for pain, discharge, redness, itching and visual disturbance. Respiratory: Negative for cough, choking, chest tightness, shortness of breath, wheezing and stridor.  Cardiovascular: Negative for chest pain, palpitations and leg swelling. Gastrointestinal: Negative for abdominal distention. Genitourinary: Negative for dysuria, urgency, frequency, hematuria Musculoskeletal: Negative for back pain, joint swelling, arthralgia and gait problem. Neurological: Negative for dizziness, tremors, seizures, syncope, facial asymmetry, speech difficulty, weakness, light-headedness, numbness and headaches.  Hematological: Negative for adenopathy. Does not bruise/bleed easily. Patient  Active Problem List   Diagnosis Date Noted  . S/P partial resection of colon 03/05/2016  . H/O diverticulitis of colon   . Diverticulitis of large intestine without perforation or abscess without bleeding   . Adiposity 01/03/2016  . Diverticulitis 12/26/2015  . Diverticulitis of colon with perforation     No Known Allergies  Prior to Admission medications   Medication Sig Start Date End Date Taking? Authorizing Provider  acetaminophen (TYLENOL)  325 MG tablet Take 650 mg by mouth every 6 (six) hours as needed.   Yes [provider]    Past Medical, Surgical Family and Social History reviewed and updated.    Objective:   Today's Vitals   01/28/18 0944 01/28/18 1028  BP: (!) 138/100 130/90  Pulse: 84   Temp: 98.7 F (37.1 C)   SpO2: 98%   Weight: 143 lb (64.9 kg)   Height: 5\' 1"  (1.549 m)     Wt Readings from Last 3 Encounters:  01/28/18 143 lb (64.9 kg)  04/15/16 204 lb 12.8 oz (92.9 kg)  03/13/16 198 lb (89.8 kg)   Physical Exam Constitutional: Patient appears well-developed and well-nourished. No distress. HENT: Normocephalic, atraumatic, External right and left ear normal. Oropharynx is clear and moist.  Eyes: Conjunctivae and EOM are normal. PERRLA, no scleral icterus. Neck: Normal ROM. Neck supple. No JVD. No tracheal deviation. No thyromegaly. CVS: RRR, S1/S2 +, no murmurs, no gallops, no carotid bruit.  Pulmonary: Effort and breath sounds normal, no stridor, rhonchi, wheezes, rales.  Abdominal: Soft. BS +, no distension, tenderness, rebound or guarding.  Musculoskeletal: Normal range of motion. No edema and no tenderness.  Neuro: Alert. Normal reflexes, muscle tone coordination. No cranial nerve deficit. Skin: Skin is warm and dry. No rash noted. Not diaphoretic. No erythema. No pallor. Psychiatric: Normal mood and affect. Behavior, judgment, thought content normal.   Assessment & Plan:  1. Wellness examination Age-appropriate anticipatory guidance provided  Encouraged efforts to reduce weight include engaging in physical activity as tolerated with goal of 150 minutes per week. Improve dietary choices and eat a meal regimen consistent with a Mediterranean or DASH diet. Reduce simple carbohydrates. Do not skip meals and eat healthy snacks throughout the day to avoid over-eating at dinner. Set a goal weight loss that is achievable for you. Screening Labs and written consent sign by patient which advises  results will be interpreted and managed by PCP. Scan copy of consent will be sent to EMR. POCT Urinalysis Dipstick, unremarkable.  Orders Placed This Encounter  Procedures  . CBC w/Diff/Platelet    Standing Status:   Future    Standing Expiration Date:   02/04/2018  . Comprehensive metabolic panel    Standing Status:   Future    Standing Expiration Date:   02/04/2018    Order Specific Question:   Has the patient fasted?    Answer:   No  . Lipid panel    Standing Status:   Future    Standing Expiration Date:   02/04/2018    Order Specific Question:   Has the patient fasted?    Answer:   No  . POCT Urinalysis Dipstick-negative    If symptoms worsen or do not improve, return for follow-up, follow-up with PCP, or at the emergency department if severity of symptoms warrant a higher level of care.    Godfrey PickKimberly S. Tiburcio PeaHarris, MSN, FNP-C [facility]  [address]  [place], [place] [id number] [phone]

## 2018-07-08 ENCOUNTER — Ambulatory Visit
Admission: EM | Admit: 2018-07-08 | Discharge: 2018-07-08 | Disposition: A | Discharge status: Home / Self Care | Payer: 59 | Attending: Family Medicine | Admitting: Family Medicine

## 2018-07-08 ENCOUNTER — Other Ambulatory Visit: Payer: Self-pay

## 2018-07-08 DIAGNOSIS — B9689 Other specified bacterial agents as the cause of diseases classified elsewhere: Secondary | ICD-10-CM | POA: Diagnosis not present

## 2018-07-08 DIAGNOSIS — R319 Hematuria, unspecified: Secondary | ICD-10-CM | POA: Diagnosis not present

## 2018-07-08 DIAGNOSIS — N390 Urinary tract infection, site not specified: Secondary | ICD-10-CM | POA: Diagnosis not present

## 2018-07-08 DIAGNOSIS — B9789 Other viral agents as the cause of diseases classified elsewhere: Secondary | ICD-10-CM | POA: Diagnosis not present

## 2018-07-08 LAB — URINALYSIS, COMPLETE (UACMP) WITH MICROSCOPIC
BILIRUBIN URINE: NEGATIVE
GLUCOSE, UA: NEGATIVE mg/dL
KETONES UR: NEGATIVE mg/dL
NITRITE: POSITIVE — AB
PH: 7 (ref 5.0–8.0)
Protein, ur: 100 mg/dL — AB
Specific Gravity, Urine: 1.02 (ref 1.005–1.030)
WBC, UA: >50 WBC/hpf (ref 0–5)

## 2018-07-08 MED ORDER — CEPHALEXIN 500 MG PO CAPS: 500.0000 mg | ORAL_CAPSULE | Freq: Two times a day (BID) | ORAL | 0 refills | Status: AC

## 2018-07-08 NOTE — ED Provider Notes (Signed)
[facility]    CSN: [id number] Arrival date & time: 07/08/18  0841     History   Chief Complaint Chief Complaint  Patient presents with  . Urinary Frequency    HPI Sherri Avila is a 42 y.o. female.   The history is provided by the patient.  Urinary Frequency  This is a new problem. The current episode started more than 1 week ago. The problem occurs constantly. The problem has not changed since onset.Pertinent negatives include no chest pain, no abdominal pain, no headaches and no shortness of breath. She has tried water for the symptoms. The treatment provided no relief.    Past Medical History:  Diagnosis Date  . Diverticulitis   . Diverticulitis   . Ovarian cyst     Patient Active Problem List   Diagnosis Date Noted  . S/P partial resection of colon 03/05/2016  . H/O diverticulitis of colon   . Diverticulitis of large intestine without perforation or abscess without bleeding   . Adiposity 01/03/2016  . Diverticulitis 12/26/2015  . Diverticulitis of colon with perforation     Past Surgical History:  Procedure Laterality Date  . CESAREAN SECTION  2009  . COLONOSCOPY  2006   Dr. Whitney PostMahsood  . COLONOSCOPY WITH PROPOFOL N/A 02/17/2016   Procedure: COLONOSCOPY WITH PROPOFOL;  Surgeon: Midge Miniumarren Wohl, MD;  [facility];  Service: Endoscopy;  Laterality: N/A;  . LAPAROSCOPIC OOPHERECTOMY Right 2015  . LAPAROSCOPIC SIGMOID COLECTOMY N/A 03/05/2016   Procedure: LAPAROSCOPIC SIGMOID COLECTOMY;  Surgeon: Ricarda Frameharles Woodham, MD;  Location: [facility];  Service: General;  Laterality: N/A;  . OVARIAN CYST REMOVAL    . TUBAL LIGATION  2012    OB History   None      Home Medications    Prior to Admission medications   Medication Sig Start Date End Date Taking? Authorizing Provider  acetaminophen (TYLENOL) 325 MG tablet Take 650 mg by mouth every 6 (six) hours as needed.    [provider]  cephALEXin (KEFLEX) 500 MG capsule Take 1  capsule (500 mg total) by mouth 2 (two) times daily. 07/08/18   Payton Mccallumonty, Laurel Harnden, MD    Family History Family History  Problem Relation Age of Onset  . Bradycardia Father        with Pacemaker  . Heart disease Maternal Grandmother   . Diabetes Maternal Grandmother     Social History Social History   Tobacco Use  . Smoking status: Never Smoker  . Smokeless tobacco: Never Used  Substance Use Topics  . Alcohol use: No  . Drug use: No     Allergies   Patient has no known allergies.   Review of Systems Review of Systems  Respiratory: Negative for shortness of breath.   Cardiovascular: Negative for chest pain.  Gastrointestinal: Negative for abdominal pain.  Genitourinary: Positive for frequency.  Neurological: Negative for headaches.     Physical Exam Triage Vital Signs ED Triage Vitals  Enc Vitals Group     BP 07/08/18 0856 (!) 134/92     Pulse Rate 07/08/18 0856 83     Resp 07/08/18 0856 18     Temp 07/08/18 0856 98.1 F (36.7 C)     Temp Source 07/08/18 0856 Oral     SpO2 07/08/18 0856 100 %     Weight 07/08/18 0854 135 lb (61.2 kg)     Height 07/08/18 0854 5\' 2"  (1.575 m)     Head Circumference --  Peak Flow --      Pain Score 07/08/18 0854 0     Pain Loc --      Pain Edu? --      Excl. in GC? --    No data found.  Updated Vital Signs BP (!) 134/92 (BP Location: Left Arm)   Pulse 83   Temp 98.1 F (36.7 C) (Oral)   Resp 18   Ht 5\' 2"  (1.575 m)   Wt 61.2 kg   SpO2 100%   BMI 24.69 kg/m   Visual Acuity Right Eye Distance:   Left Eye Distance:   Bilateral Distance:    Right Eye Near:   Left Eye Near:    Bilateral Near:     Physical Exam  Constitutional: She appears well-developed and well-nourished. No distress.  Abdominal: Soft. There is no tenderness.  Skin: She is not diaphoretic.  Nursing note and vitals reviewed.    UC Treatments / Results  Labs (all labs ordered are listed, but only abnormal results are displayed) Labs  Reviewed  URINALYSIS, COMPLETE (UACMP) WITH MICROSCOPIC - Abnormal; Notable for the following components:      Result Value   APPearance CLOUDY (*)    Hgb urine dipstick MODERATE (*)    Protein, ur 100 (*)    Nitrite POSITIVE (*)    Leukocytes, UA SMALL (*)    Bacteria, UA MANY (*)    All other components within normal limits  URINE CULTURE    EKG None  Radiology No results found.  Procedures Procedures (including critical care time)  Medications Ordered in UC Medications - No data to display  Initial Impression / Assessment and Plan / UC Course  I have reviewed the triage vital signs and the nursing notes.  Pertinent labs & imaging results that were available during my care of the patient were reviewed by me and considered in my medical decision making (see chart for details).      Final Clinical Impressions(s) / UC Diagnoses   Final diagnoses:  Urinary tract infection with hematuria, site unspecified    ED Prescriptions    Medication Sig Dispense Auth. Provider   cephALEXin (KEFLEX) 500 MG capsule Take 1 capsule (500 mg total) by mouth 2 (two) times daily. 14 capsule Payton Mccallum, MD     1. Lab results and diagnosis reviewed with patient 2. rx as per orders above; reviewed possible side effects, interactions, risks and benefits  3. Recommend supportive treatment with fluids  4. Follow-up prn if symptoms worsen or don't improve   Controlled Substance Prescriptions [place] Controlled Substance Registry consulted? Not Applicable   Payton Mccallum, MD 07/08/18 1221

## 2018-07-08 NOTE — ED Triage Notes (Signed)
Patient complains of urinary frequency, urgency and burning with urination that started 2 weeks ago.

## 2018-07-10 LAB — URINE CULTURE: Special Requests: NORMAL

## 2018-07-11 ENCOUNTER — Telehealth (HOSPITAL_COMMUNITY): Payer: Self-pay | Admitting: Emergency Medicine

## 2018-07-11 NOTE — Telephone Encounter (Signed)
Urine culture was positive for e coli and was given keflex  at urgent care visit. Attempted to reach patient. No answer at this time.
